# Patient Record
Sex: Male | Born: 1970 | Race: White | Hispanic: No | Marital: Married | Smoking: Current every day smoker
Health system: Southern US, Community
[De-identification: ages and names within clinical notes are randomized; demographics above are authoritative.]

## PROBLEM LIST (undated history)

## (undated) DIAGNOSIS — G4730 Sleep apnea, unspecified: Secondary | ICD-10-CM

## (undated) HISTORY — PX: APPENDECTOMY: SHX54

## (undated) HISTORY — PX: SMALL INTESTINE SURGERY: SHX150

---

## 2018-07-16 ENCOUNTER — Encounter: Payer: Self-pay | Admitting: Cardiology

## 2018-07-16 ENCOUNTER — Ambulatory Visit (INDEPENDENT_AMBULATORY_CARE_PROVIDER_SITE_OTHER): Payer: BLUE CROSS/BLUE SHIELD | Admitting: Cardiology

## 2018-07-16 DIAGNOSIS — R000 Tachycardia, unspecified: Secondary | ICD-10-CM | POA: Insufficient documentation

## 2018-07-16 DIAGNOSIS — F17200 Nicotine dependence, unspecified, uncomplicated: Secondary | ICD-10-CM | POA: Diagnosis not present

## 2018-07-16 DIAGNOSIS — K50919 Crohn's disease, unspecified, with unspecified complications: Secondary | ICD-10-CM | POA: Diagnosis not present

## 2018-07-16 DIAGNOSIS — R0789 Other chest pain: Secondary | ICD-10-CM

## 2018-07-16 DIAGNOSIS — IMO0001 Reserved for inherently not codable concepts without codable children: Secondary | ICD-10-CM | POA: Insufficient documentation

## 2018-07-16 DIAGNOSIS — K5090 Crohn's disease, unspecified, without complications: Secondary | ICD-10-CM | POA: Insufficient documentation

## 2018-07-16 NOTE — Patient Instructions (Signed)
Medication Instructions:  Your physician recommends that you continue on your current medications as directed. Please refer to the Current Medication list given to you today.  If you need a refill on your cardiac medications before your next appointment, please call your pharmacy.   Lab work: NONE  If you have labs (blood work) drawn today and your tests are completely normal, you will receive your results only by: Marland Kitchen MyChart Message (if you have MyChart) OR . A paper copy in the mail If you have any lab test that is abnormal or we need to change your treatment, we will call you to review the results.  Testing/Procedures: Your physician has requested that you have an echocardiogram. Echocardiography is a painless test that uses sound waves to create images of your heart. It provides your doctor with information about the size and shape of your heart and how well your heart's chambers and valves are working. This procedure takes approximately one hour. There are no restrictions for this procedure.  Your physician has recommended that you wear a holter monitor. Holter monitors are medical devices that record the heart's electrical activity. Doctors most often use these monitors to diagnose arrhythmias. Arrhythmias are problems with the speed or rhythm of the heartbeat. The monitor is a small, portable device. You can wear one while you do your normal daily activities. This is usually used to diagnose what is causing palpitations/syncope (passing out). You will wear this device for 48 hours.  Your physician has requested that you have a stress echocardiogram. For further information please visit [email or web address] Please follow instruction sheet as given.      Follow-Up: At [facility], you and your health needs are our priority.  As part of our continuing mission to provide you with exceptional heart care, we have created designated Provider Care Teams.  These Care Teams include your  primary Cardiologist (physician) and Advanced Practice Providers (APPs -  Physician Assistants and Nurse Practitioners) who all work together to provide you with the care you need, when you need it. You will need a follow up appointment in 6 weeks.     Any Other Special Instructions Will Be Listed Below  Exercise Stress Test An exercise stress test is a test to check how your heart works during exercise. You will need to walk on a treadmill or ride an exercise bike for this test. An electrocardiogram (ECG) will record your heartbeat when you are at rest and when you are exercising. You may have an ultrasound or nuclear test after the exercise test. The test is done to check for coronary artery disease (CAD). It is also done to:  See how well you can exercise.  Watch for high blood pressure during exercise.  Test how well you can exercise after treatment.  Check the blood flow to your arms and legs. If your test result is not normal, more testing may be needed. What happens before the procedure?  Follow instructions from your doctor about what you cannot eat or drink. ? Do not have any drinks or foods that have caffeine in them for 24 hours before the test, or as told by your doctor. This includes coffee, tea (even decaf tea), sodas, chocolate, and cocoa.  Ask your doctor about changing or stopping your normal medicines. This is important if you: ? Take diabetes medicines. ? Take beta-blocker medicines. ? Wear a nitroglycerin patch.  If you use an inhaler, bring it with you to the test.  Do not put  lotions, powders, creams, or oils on your chest before the test.  Wear comfortable shoes and clothing.  Do not use any products that have nicotine or tobacco in them, such as cigarettes and e-cigarettes. Stop using them at least 4 hours before the test. If you need help quitting, ask your doctor. What happens during the procedure?  Patches (electrodes) will be put on your chest.  Wires  will be connected to the patches. The wires will send signals to a machine to record your heartbeat.  Your heart rate will be watched while you are resting and while you are exercising. Your blood pressure will also be watched during the test.  You will walk on a treadmill or use a stationary bike. If you cannot use these, you may be asked to turn a crank with your hands.  The activity will get harder and will raise your heart rate.  You may be asked to breathe into a tube a few times during the test. This measures the gases that you breathe out.  You will be asked how you are feeling throughout the test.  You will exercise until your heart reaches a target heart rate. You will stop early if: ? You feel dizzy. ? You have chest pain. ? You are out of breath. ? Your blood pressure is too high or too low. ? You have an irregular heartbeat. ? You have pain or aching in your arms or legs. The procedure may vary among doctors and hospitals. What happens after the procedure?  Your blood pressure, heart rate, breathing rate, and blood oxygen level will be watched after the test.  You may return to your normal diet and activities as told by your doctor.  It is up to you to get the results of your test. Ask your doctor, or the department that is doing the test, when your results will be ready. Summary  An exercise stress test is a test to check how your heart works during exercise.  This test is done to check for coronary artery disease.  Your heart rate will be watched while you are resting and while you are exercising.  Follow instructions from your doctor about what you cannot eat or drink before the test. This information is not intended to replace advice given to you by your health care provider. Make sure you discuss any questions you have with your health care provider. Document Released: 10/17/2007 Document Revised: 07/31/2016 Document Reviewed: 07/31/2016 Elsevier Interactive  Patient Education  2019 [employer].    Echocardiogram An echocardiogram is a procedure that uses painless sound waves (ultrasound) to produce an image of the heart. Images from an echocardiogram can provide important information about:  Signs of coronary artery disease (CAD).  Aneurysm detection. An aneurysm is a weak or damaged part of an artery wall that bulges out from the normal force of blood pumping through the body.  Heart size and shape. Changes in the size or shape of the heart can be associated with certain conditions, including heart failure, aneurysm, and CAD.  Heart muscle function.  Heart valve function.  Signs of a past heart attack.  Fluid buildup around the heart.  Thickening of the heart muscle.  A tumor or infectious growth around the heart valves. Tell a health care provider about:  Any allergies you have.  All medicines you are taking, including vitamins, herbs, eye drops, creams, and over-the-counter medicines.  Any blood disorders you have.  Any surgeries you have had.  Any medical conditions you have.  Whether you are pregnant or may be pregnant. What are the risks? Generally, this is a safe procedure. However, problems may occur, including:  Allergic reaction to dye (contrast) that may be used during the procedure. What happens before the procedure? No specific preparation is needed. You may eat and drink normally. What happens during the procedure?   An IV tube may be inserted into one of your veins.  You may receive contrast through this tube. A contrast is an injection that improves the quality of the pictures from your heart.  A gel will be applied to your chest.  A wand-like tool (transducer) will be moved over your chest. The gel will help to transmit the sound waves from the transducer.  The sound waves will harmlessly bounce off of your heart to allow the heart images to be captured in real-time motion. The images will be  recorded on a computer. The procedure may vary among health care providers and hospitals. What happens after the procedure?  You may return to your normal, everyday life, including diet, activities, and medicines, unless your health care provider tells you not to do that. Summary  An echocardiogram is a procedure that uses painless sound waves (ultrasound) to produce an image of the heart.  Images from an echocardiogram can provide important information about the size and shape of your heart, heart muscle function, heart valve function, and fluid buildup around your heart.  You do not need to do anything to prepare before this procedure. You may eat and drink normally.  After the echocardiogram is completed, you may return to your normal, everyday life, unless your health care provider tells you not to do that. This information is not intended to replace advice given to you by your health care provider. Make sure you discuss any questions you have with your health care provider. Document Released: 04/27/2000 Document Revised: 06/02/2016 Document Reviewed: 06/02/2016 Elsevier Interactive Patient Education  2019 [employer].

## 2018-07-16 NOTE — Progress Notes (Signed)
Cardiology Consultation:    Date:  07/16/2018   ID:  Trevor Willis, DOB [birth year], MRN [id number]  PCP:  Baldo Ash, FNP  Cardiologist:  Gypsy Balsam, MD   Referring MD: Baldo Ash, FNP   Chief Complaint  Patient presents with  . Tachycardia  I have fast heart rate and chest pain  History of Present Illness:    Trevor Willis is a 48 y.o. male who is being seen today for the evaluation of tachycardia and chest pain at the request of Baldo Ash, FNP.  For years he knew that he got elevated heart rate he does have a Fitbit and during the day his heart rates typically between 1 10-1 30.  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  He does not have any dizziness he never passed out.  He recently was put on beta-blocker which slows down his heart rate.  He tells me that he takes beta-blocker for night.  Does not have side effect of this medication.  He also complained of having chest pain pain is located in the left side of his chest which he described the strength of the pain as 2-3 in scale up to 10.  There is no provoking or relieving factors that pain can last up to an hour.  He does not have any shortness of breath he does not have any dizziness associated with this.  Taking deep breath coughing does not make any difference. Risk factors for coronary artery disease include smoking.  Likely his cholesterol looks good.  He does not have family history of premature coronary disease.  No past medical history on file.    Current Medications: Current Meds  Medication Sig  . metoprolol succinate (TOPROL-XL) 100 MG 24 hr tablet Take 100 mg by mouth daily. Take with or immediately following a meal.  . valsartan (DIOVAN) 80 MG tablet Take 1 tablet by mouth daily.  . [DISCONTINUED] metoprolol tartrate (LOPRESSOR) 100 MG tablet Take 1 tablet by mouth daily.      Allergies:   Promethazine   Social History   Socioeconomic History  . Marital status: Married    Spouse name: Not on file  . Number of children: Not on file  . Years of education: Not on file  . Highest education level: Not on file  Occupational History  . Not on file  Social Needs  . Financial resource strain: Not on file  . Food insecurity:    Worry: Not on file    Inability: Not on file  . Transportation needs:    Medical: Not on file    Non-medical: Not on file  Tobacco Use  . Smoking status: Current Every Day Smoker    Types: Cigarettes  . Smokeless tobacco: Never Used  Substance and Sexual Activity  . Alcohol use: Yes    Comment: mild  . Drug use: Never  . Sexual activity: Not on file  Lifestyle  . Physical activity:    Days per week: Not on file    Minutes per session: Not on file  . Stress: Not on file  Relationships  . Social connections:    Talks on phone: Not on file    Gets together: Not on file    Attends religious service: Not on file    Active member of club or organization: Not on file  Attends meetings of clubs or organizations: Not on file    Relationship status: Not on file  Other Topics Concern  . Not on file  Social History Narrative  . Not on file     Family History: The patient's family history includes Anxiety disorder in his mother; Breast cancer in his mother; Diabetes in his mother; Hypertension in his father and mother. ROS:   Please see the history of present illness.    All 14 point review of systems negative except as described per history of present illness.  EKGs/Labs/Other Studies Reviewed:    The following studies were reviewed today: EKG showed sinus tachycardia normal P interval normal QS complex duration morphology   Recent Labs: No results found for requested labs within last 8760 hours.  Recent Lipid Panel No results found for: CHOL, TRIG, HDL, CHOLHDL, VLDL, LDLCALC, LDLDIRECT  Physical Exam:    VS:  BP  110/80   Pulse 82   Ht 6\' 1"  (1.854 m)   Wt 218 lb 3.2 oz (99 kg)   SpO2 97%   BMI 28.79 kg/m     Wt Readings from Last 3 Encounters:  07/16/18 218 lb 3.2 oz (99 kg)     GEN:  Well nourished, well developed in no acute distress HEENT: Normal NECK: No JVD; No carotid bruits LYMPHATICS: No lymphadenopathy CARDIAC: RRR, no murmurs, no rubs, no gallops RESPIRATORY:  Clear to auscultation without rales, wheezing or rhonchi  ABDOMEN: Soft, non-tender, non-distended MUSCULOSKELETAL:  No edema; No deformity  SKIN: Warm and dry NEUROLOGIC:  Alert and oriented x 3 PSYCHIATRIC:  Normal affect   ASSESSMENT:    1. Atypical chest pain   2. Tachycardia   3. Crohn's disease with complication, unspecified gastrointestinal tract location (HCC)   4. Smoking    PLAN:    In order of problems listed above:  1. Atypical chest pain.  He does have some risk factors for coronary artery disease therefore the most prudent approach to this situation will be to proceed with stress testing.  I will schedule him to have stress echocardiogram. 2. Tachycardia he is not anemic his oxygen is good his thyroid profile is also normal.  I will ask him to wear 48 hours Holter monitor to see what the heart rate distribution is.  And see what the mechanism of his tachycardia is.  As a part of evaluation I will ask him to have an echocardiogram done to assess left ventricular ejection fraction.  Stress test also will be done to see heart rate behavior during the exercise. 3. Crohn's disease stable he does not use any medications. 4. Smoking obviously a problem had a long discussion about it and I strongly recommended to quit.   Medication Adjustments/Labs and Tests Ordered: Current medicines are reviewed at length with the patient today.  Concerns regarding medicines are outlined above.  No orders of the defined types were placed in this encounter.  No orders of the defined types were placed in this  encounter.   Signed, Georgeanna Lea, MD, [facility]. 07/16/2018 9:52 AM    [facility]

## 2018-08-11 ENCOUNTER — Telehealth: Payer: Self-pay | Admitting: Cardiology

## 2018-08-11 NOTE — Telephone Encounter (Signed)
Echo 04/09-stress echo 04/10

## 2018-08-11 NOTE — Addendum Note (Signed)
Addended by: Roddie Mc on: 08/11/2018 03:43 PM   Modules accepted: Orders

## 2018-08-14 ENCOUNTER — Telehealth: Payer: Self-pay | Admitting: Cardiology

## 2018-08-14 NOTE — Telephone Encounter (Signed)
° °  Primary Cardiologist:  No primary care provider on file.   Patient contacted.  History reviewed.  No symptoms to suggest any unstable cardiac conditions.  Based on discussion, with current pandemic situation, we will be postponing this appointment for Verline Lema with a plan for f/u in 6-12 wks or sooner if feasible/necessary.  If symptoms change, he has been instructed to contact our office.   Routing to C19 CANCEL pool for tracking (P CV DIV CV19 CANCEL - reason for visit "other.") and assigning priority (1 = 4-6 wks, 2 = 6-12 wks, 3 = >12 wks).   Samella Parr  08/14/2018 10:34 AM         .

## 2018-08-14 NOTE — Telephone Encounter (Signed)
2-4/17 °

## 2018-08-18 ENCOUNTER — Other Ambulatory Visit: Payer: BLUE CROSS/BLUE SHIELD

## 2018-08-21 ENCOUNTER — Other Ambulatory Visit: Payer: BLUE CROSS/BLUE SHIELD

## 2018-08-22 ENCOUNTER — Other Ambulatory Visit: Payer: BLUE CROSS/BLUE SHIELD

## 2018-08-26 NOTE — Telephone Encounter (Signed)
Left voicemail to schedule for a Televisit

## 2018-08-26 NOTE — Telephone Encounter (Signed)
Recalls for Echo and Stress Echo placed

## 2018-08-29 ENCOUNTER — Ambulatory Visit: Payer: BLUE CROSS/BLUE SHIELD | Admitting: Cardiology

## 2018-10-02 ENCOUNTER — Other Ambulatory Visit: Payer: Self-pay

## 2018-10-02 ENCOUNTER — Ambulatory Visit (INDEPENDENT_AMBULATORY_CARE_PROVIDER_SITE_OTHER): Payer: BLUE CROSS/BLUE SHIELD

## 2018-10-02 DIAGNOSIS — R000 Tachycardia, unspecified: Secondary | ICD-10-CM

## 2018-10-02 DIAGNOSIS — R0789 Other chest pain: Secondary | ICD-10-CM

## 2018-10-02 NOTE — Progress Notes (Signed)
Complete echocardiogram has been performed.  Jimmy Sheralee Qazi RDCS, RVT 

## 2020-06-12 ENCOUNTER — Emergency Department (HOSPITAL_COMMUNITY): Payer: No Typology Code available for payment source

## 2020-06-12 ENCOUNTER — Other Ambulatory Visit: Payer: Self-pay

## 2020-06-12 ENCOUNTER — Emergency Department (HOSPITAL_COMMUNITY)
Admission: EM | Admit: 2020-06-12 | Discharge: 2020-06-13 | Disposition: A | Discharge status: Home / Self Care | Payer: No Typology Code available for payment source | Attending: Emergency Medicine | Admitting: Emergency Medicine

## 2020-06-12 DIAGNOSIS — Y92410 Unspecified street and highway as the place of occurrence of the external cause: Secondary | ICD-10-CM | POA: Insufficient documentation

## 2020-06-12 DIAGNOSIS — R059 Cough, unspecified: Secondary | ICD-10-CM | POA: Diagnosis not present

## 2020-06-12 DIAGNOSIS — F17210 Nicotine dependence, cigarettes, uncomplicated: Secondary | ICD-10-CM | POA: Insufficient documentation

## 2020-06-12 DIAGNOSIS — R0781 Pleurodynia: Secondary | ICD-10-CM | POA: Diagnosis not present
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
## 2020-06-13 MED ORDER — LIDOCAINE 5 % EX PTCH: 1.0000 | MEDICATED_PATCH | CUTANEOUS | 0 refills | Status: AC

## 2020-06-13 NOTE — Discharge Instructions (Addendum)
You were evaluated in the Emergency Department and after careful evaluation, we did not find any emergent condition requiring admission or further testing in the hospital.  Your exam/testing today was overall reassuring.  Symptoms seem to be due to bruised ribs.  X-ray did not show any broken bones or emergencies.  You will likely be more sore tomorrow.  Please use Tylenol 1000 mg every 4-6 hours at home.  He can also use the numbing patches provided.  Please return to the Emergency Department if you experience any worsening of your condition.  Thank you for allowing [place] to be a part of your care.
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
## 2020-09-11 ENCOUNTER — Inpatient Hospital Stay (HOSPITAL_COMMUNITY)
Admission: EM | Admit: 2020-09-11 | Discharge: 2020-09-15 | DRG: 386 | Disposition: A | Discharge status: Home / Self Care | Payer: BC Managed Care – PPO | Attending: Internal Medicine | Admitting: Internal Medicine

## 2020-09-11 ENCOUNTER — Other Ambulatory Visit: Payer: Self-pay

## 2020-09-11 ENCOUNTER — Encounter (HOSPITAL_COMMUNITY): Payer: Self-pay

## 2020-09-11 ENCOUNTER — Emergency Department (HOSPITAL_COMMUNITY): Payer: BC Managed Care – PPO

## 2020-09-11 DIAGNOSIS — Z20822 Contact with and (suspected) exposure to covid-19: Secondary | ICD-10-CM | POA: Diagnosis present

## 2020-09-11 DIAGNOSIS — Z8379 Family history of other diseases of the digestive system: Secondary | ICD-10-CM

## 2020-09-11 DIAGNOSIS — E871 Hypo-osmolality and hyponatremia: Secondary | ICD-10-CM | POA: Diagnosis present

## 2020-09-11 DIAGNOSIS — Z8249 Family history of ischemic heart disease and other diseases of the circulatory system: Secondary | ICD-10-CM

## 2020-09-11 DIAGNOSIS — F17210 Nicotine dependence, cigarettes, uncomplicated: Secondary | ICD-10-CM | POA: Diagnosis present

## 2020-09-11 DIAGNOSIS — K5080 Crohn's disease of both small and large intestine without complications: Secondary | ICD-10-CM | POA: Diagnosis not present

## 2020-09-11 DIAGNOSIS — L089 Local infection of the skin and subcutaneous tissue, unspecified: Secondary | ICD-10-CM | POA: Diagnosis present

## 2020-09-11 DIAGNOSIS — K5010 Crohn's disease of large intestine without complications: Secondary | ICD-10-CM | POA: Diagnosis present

## 2020-09-11 DIAGNOSIS — G4700 Insomnia, unspecified: Secondary | ICD-10-CM | POA: Diagnosis present

## 2020-09-11 DIAGNOSIS — Z9049 Acquired absence of other specified parts of digestive tract: Secondary | ICD-10-CM

## 2020-09-11 DIAGNOSIS — K56699 Other intestinal obstruction unspecified as to partial versus complete obstruction: Secondary | ICD-10-CM

## 2020-09-11 DIAGNOSIS — K56609 Unspecified intestinal obstruction, unspecified as to partial versus complete obstruction: Secondary | ICD-10-CM

## 2020-09-11 DIAGNOSIS — G4733 Obstructive sleep apnea (adult) (pediatric): Secondary | ICD-10-CM | POA: Diagnosis present

## 2020-09-11 DIAGNOSIS — K5900 Constipation, unspecified: Secondary | ICD-10-CM

## 2020-09-11 DIAGNOSIS — Z79899 Other long term (current) drug therapy: Secondary | ICD-10-CM

## 2020-09-11 DIAGNOSIS — K50919 Crohn's disease, unspecified, with unspecified complications: Secondary | ICD-10-CM | POA: Diagnosis not present

## 2020-09-11 DIAGNOSIS — I10 Essential (primary) hypertension: Secondary | ICD-10-CM | POA: Diagnosis present

## 2020-09-11 DIAGNOSIS — K5669 Other partial intestinal obstruction: Secondary | ICD-10-CM | POA: Diagnosis present

## 2020-09-11 DIAGNOSIS — G2581 Restless legs syndrome: Secondary | ICD-10-CM | POA: Diagnosis present

## 2020-09-11 DIAGNOSIS — N179 Acute kidney failure, unspecified: Secondary | ICD-10-CM | POA: Diagnosis not present

## 2020-09-11 DIAGNOSIS — K567 Ileus, unspecified: Secondary | ICD-10-CM

## 2020-09-11 DIAGNOSIS — E876 Hypokalemia: Secondary | ICD-10-CM | POA: Diagnosis not present

## 2020-09-11 DIAGNOSIS — Z888 Allergy status to other drugs, medicaments and biological substances status: Secondary | ICD-10-CM

## 2020-09-11 DIAGNOSIS — R000 Tachycardia, unspecified: Secondary | ICD-10-CM

## 2020-09-11 DIAGNOSIS — E861 Hypovolemia: Secondary | ICD-10-CM | POA: Diagnosis present

## 2020-09-11 HISTORY — DX: Sleep apnea, unspecified: G47.30

## 2020-09-11 LAB — COMPREHENSIVE METABOLIC PANEL
ALT: 25 U/L (ref 0–44)
AST: 27 U/L (ref 15–41)
Albumin: 3.8 g/dL (ref 3.5–5.0)
Alkaline Phosphatase: 71 U/L (ref 38–126)
Anion gap: 12 (ref 5–15)
BUN: 11 mg/dL (ref 6–20)
CO2: 24 mmol/L (ref 22–32)
Calcium: 9.3 mg/dL (ref 8.9–10.3)
Chloride: 98 mmol/L (ref 98–111)
Creatinine, Ser: 1.59 mg/dL — ABNORMAL HIGH (ref 0.61–1.24)
GFR, Estimated: 54 mL/min — ABNORMAL LOW (ref >60)
Glucose, Bld: 120 mg/dL — ABNORMAL HIGH (ref 70–99)
Potassium: 3.5 mmol/L (ref 3.5–5.1)
Sodium: 134 mmol/L — ABNORMAL LOW (ref 135–145)
Total Bilirubin: 1.4 mg/dL — ABNORMAL HIGH (ref 0.3–1.2)
Total Protein: 7.6 g/dL (ref 6.5–8.1)

## 2020-09-11 LAB — URINALYSIS, ROUTINE W REFLEX MICROSCOPIC
Bacteria, UA: NONE SEEN
Bilirubin Urine: NEGATIVE
Glucose, UA: NEGATIVE mg/dL
Hgb urine dipstick: NEGATIVE
Ketones, ur: NEGATIVE mg/dL
Leukocytes,Ua: NEGATIVE
Nitrite: NEGATIVE
Protein, ur: 100 mg/dL — AB
Specific Gravity, Urine: 1.015 (ref 1.005–1.030)
pH: 5 (ref 5.0–8.0)

## 2020-09-11 LAB — CBC WITH DIFFERENTIAL/PLATELET
Abs Immature Granulocytes: 0.04 10*3/uL (ref 0.00–0.07)
Basophils Absolute: 0.1 10*3/uL (ref 0.0–0.1)
Basophils Relative: 0 %
Eosinophils Absolute: 0.1 10*3/uL (ref 0.0–0.5)
Eosinophils Relative: 1 %
HCT: 48.2 % (ref 39.0–52.0)
Hemoglobin: 17.1 g/dL — ABNORMAL HIGH (ref 13.0–17.0)
Immature Granulocytes: 0 %
Lymphocytes Relative: 22 %
Lymphs Abs: 2.8 10*3/uL (ref 0.7–4.0)
MCH: 31.8 pg (ref 26.0–34.0)
MCHC: 35.5 g/dL (ref 30.0–36.0)
MCV: 89.8 fL (ref 80.0–100.0)
Monocytes Absolute: 0.9 10*3/uL (ref 0.1–1.0)
Monocytes Relative: 7 %
Neutro Abs: 9 10*3/uL — ABNORMAL HIGH (ref 1.7–7.7)
Neutrophils Relative %: 70 %
Platelets: 224 10*3/uL (ref 150–400)
RBC: 5.37 MIL/uL (ref 4.22–5.81)
RDW: 12.4 % (ref 11.5–15.5)
WBC: 12.8 10*3/uL — ABNORMAL HIGH (ref 4.0–10.5)
nRBC: 0 % (ref 0.0–0.2)

## 2020-09-11 LAB — GLUCOSE, CAPILLARY: Glucose-Capillary: 139 mg/dL — ABNORMAL HIGH (ref 70–99)

## 2020-09-11 LAB — SARS CORONAVIRUS 2 (TAT 6-24 HRS): SARS Coronavirus 2: NEGATIVE

## 2020-09-11 LAB — LIPASE, BLOOD: Lipase: 43 U/L (ref 11–51)

## 2020-09-11 MED ORDER — IOHEXOL 300 MG/ML  SOLN
100.0000 mL | Freq: Once | INTRAMUSCULAR | Status: AC | PRN
  Administered 2020-09-11: 100 mL via INTRAVENOUS

## 2020-09-11 MED ORDER — INSULIN ASPART 100 UNIT/ML IJ SOLN
0.0000 [IU] | Freq: Three times a day (TID) | INTRAMUSCULAR | Status: DC
  Administered 2020-09-12: 3 [IU] via SUBCUTANEOUS
  Administered 2020-09-12 (×2): 2 [IU] via SUBCUTANEOUS

## 2020-09-11 MED ORDER — ONDANSETRON HCL 4 MG/2ML IJ SOLN
4.0000 mg | Freq: Once | INTRAMUSCULAR | Status: AC
  Administered 2020-09-11: 4 mg via INTRAVENOUS
  Filled 2020-09-11: qty 2

## 2020-09-11 MED ORDER — LACTATED RINGERS IV SOLN: INTRAVENOUS | Status: DC

## 2020-09-11 MED ORDER — ACETAMINOPHEN 325 MG PO TABS
650.0000 mg | ORAL_TABLET | Freq: Four times a day (QID) | ORAL | Status: DC | PRN
  Administered 2020-09-13 (×2): 650 mg via ORAL
  Filled 2020-09-11 (×2): qty 2

## 2020-09-11 MED ORDER — ASPIRIN 81 MG PO CHEW: 81.0000 mg | CHEWABLE_TABLET | ORAL | Status: DC | PRN

## 2020-09-11 MED ORDER — FENTANYL CITRATE (PF) 100 MCG/2ML IJ SOLN
50.0000 ug | Freq: Once | INTRAMUSCULAR | Status: AC
Start: 2020-09-11 — End: 2020-09-11
  Administered 2020-09-11: 50 ug via INTRAVENOUS
  Filled 2020-09-11: qty 2

## 2020-09-11 MED ORDER — ENOXAPARIN SODIUM 40 MG/0.4ML IJ SOSY
40.0000 mg | PREFILLED_SYRINGE | INTRAMUSCULAR | Status: DC
  Administered 2020-09-11 – 2020-09-14 (×4): 40 mg via SUBCUTANEOUS
  Filled 2020-09-11 (×4): qty 0.4

## 2020-09-11 MED ORDER — METHYLPREDNISOLONE SODIUM SUCC 125 MG IJ SOLR
60.0000 mg | Freq: Two times a day (BID) | INTRAMUSCULAR | Status: DC
  Administered 2020-09-11 – 2020-09-15 (×8): 60 mg via INTRAVENOUS
  Filled 2020-09-11 (×8): qty 2

## 2020-09-11 MED ORDER — RAMELTEON 8 MG PO TABS
8.0000 mg | ORAL_TABLET | Freq: Every day | ORAL | Status: DC
  Administered 2020-09-11: 8 mg via ORAL
  Filled 2020-09-11 (×2): qty 1

## 2020-09-11 MED ORDER — SODIUM CHLORIDE 0.9 % IV BOLUS
1000.0000 mL | Freq: Once | INTRAVENOUS | Status: AC
  Administered 2020-09-11: 1000 mL via INTRAVENOUS

## 2020-09-11 MED ORDER — ONDANSETRON HCL 4 MG PO TABS
4.0000 mg | ORAL_TABLET | Freq: Three times a day (TID) | ORAL | Status: DC | PRN
  Administered 2020-09-12 – 2020-09-13 (×2): 4 mg via ORAL
  Filled 2020-09-11 (×3): qty 1

## 2020-09-11 NOTE — H&P (Addendum)
Date: 09/11/2020               Patient Name:  Trevor Willis MRN: [id number]  DOB: Nov 15, 1970 Age / Sex: 50 y.o., male   PCP: Eunice Blase, PA-C         Medical Service: Internal Medicine Teaching Service         Attending Physician: Dr. Inez Catalina, MD    First Contact: Dr. Evlyn Kanner Pager: [phone]  Second Contact: Dr. Eliezer Bottom Pager: [phone]       After Hours (After 5p/  First Contact Pager: [phone]  weekends / holidays): Second Contact Pager: [phone]   Chief Complaint: abdominal pain/nausea/vomiting  History of Present Illness: Trevor Willis is a 50 year old male with PMHx of Crohn's disease s/p ileocecal resection, carpal tunnel syndrome, obstructive sleep apnea not using CPAP, and insomnia secondary to reported restless leg syndrome presenting with abdominal pain, nausea/vomiting for 3-4 days duration. Patient was fist diagnosed with Crohn's disease 38 years ago with last Crohn's flare reportedly at age 53. Since then, he notes most of his complications have been related to strictures and adhesions. He is not on any disease modifying therapy at this time. He does not have a GI physician that he follows with but notes that he was in his usual state of health until Thursday when he started colon cleanse to prep for colonoscopy for colon cancer screening. He noted having significant diarrhea and nausea/vomiting with 3-4 episodes per day of non-bloody non-bilious projectile vomiting. He endorses associated sweats and chills but no subjective fevers. His last bowel movement was two days ago. He endorses ongoing nausea and decreased appetite as a result. He does endorse persistent nonradiating lower  abdominal pain that is worse with movement and describes it as "someone punching in my gut". He endorses his urine being darker in color but denies any dysuria. He drinks 4L of Dr. Reino Kent daily. He denies any chest pain, shortness of breath, lightheadedness/dizziness.   Meds:   Current Meds  Medication Sig  . aspirin 81 MG chewable tablet Chew 81 mg by mouth as needed (for chest pain).  . meloxicam (MOBIC) 15 MG tablet Take 15 mg by mouth daily.  . metoprolol succinate (TOPROL-XL) 100 MG 24 hr tablet Take 100 mg by mouth at bedtime. Take with or immediately following a meal.  . sulfamethoxazole-trimethoprim (BACTRIM DS) 800-160 MG tablet Take 1 tablet by mouth daily.  . valsartan (DIOVAN) 80 MG tablet Take 80 mg by mouth daily.   Allergies: Allergies as of 09/11/2020 - Review Complete 09/11/2020  Allergen Reaction Noted  . Promethazine Anxiety 07/16/2018  . Other Other (See Comments) 09/11/2020   Past Medical History:  Diagnosis Date  . Sleep apnea    Family History:  Family History  Problem Relation Age of Onset  . Diabetes Mother   . Hypertension Mother   . Anxiety disorder Mother   . Breast cancer Mother   . Hypertension Father   Patient has three aunt's with severe Crohn's disease, one who passed from complications related to treatment regimen.   Social History:  Patient lives at home with his wife and two children, age 54 and 64. 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 He has smoked approximately 2 packs of cigarettes weekly since the age of 70, sometimes more. Currently smokes about 2-3 cigarettes daily. He  has about 3 alcoholic drinks weekly. He uses CBG over the counter for insomnia and pain.   Review of Systems: A complete ROS was negative except as per HPI.   Physical Exam: Blood pressure 97/80, pulse 79, temperature 97.9 F (36.6 C), temperature source Oral, resp. rate (!) 21, height 6\' 1"  (1.854 m), weight 87.1 kg, SpO2 97 %. Physical Exam  Constitutional: Appears well-developed and well-nourished. No distress.  HENT: Normocephalic and atraumatic, EOMI, conjunctiva normal, edentulous, moist mucous  membranes Cardiovascular: Normal rate, regular rhythm, S1 and S2 present, no murmurs, rubs, gallops.  Distal pulses intact Respiratory: No respiratory distress, no accessory muscle use.  Effort is normal.  Lungs are clear to auscultation bilaterally. GI: Nondistended, soft, diffuse tenderness to palpation, worse in epigastric region and lower abdomen; decreased bowel sounds Musculoskeletal: Normal bulk and tone.  No peripheral edema noted. Neurological: Is alert and oriented x4, no apparent focal deficits noted. Skin: Warm and dry.  No rash, erythema, lesions noted. Psychiatric: Normal mood and affect. Behavior is normal. Judgment and thought content normal.   EKG: pending  CT ABDOMEN/PELVIS W CONTRAST:  IMPRESSION: 1. Fluid-filled distended jejunal loops with diffuse circumferential wall thickening in the mid and distal ileum with gradual tapering of small bowel through the mid segments into the ileum. Prior ileocecal resection with narrowing and mucosal hyperenhancement in the neo terminal ileum. 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 2. Markedly prominent lateral segment left liver and caudate lobe. Appearance similar to prior study with probable geographic fatty deposition in the caudate lobe of the liver. 3. Scattered lymph nodes in the small bowel mesentery, likely reactive.  Assessment & Plan by Problem: Active Problems:   Crohn's colitis [facility])  Trevor Willis is a 50 year old male with PMHx of Crohn's disease s/p ileocecal resection, carpal tunnel syndrome, obstructive sleep apnea not using CPAP, and insomnia secondary to reported restless leg syndrome presenting with abdominal pain, nausea/vomiting with CT Abdomen/Pelvis findings consistent with a enteritis.   Crohn's colitis  Patient has a history of Crohn's disease since age of 88 with last flare at age 89. He is s/p ileocecal  resection. He is not on any disease modifying agents at this time. He presents with 3 days of nausea/vomiting, abdominal pain since taking bowel prep for a screening colonoscopy. Initially also had profuse diarrhea that has since resolved and last BM was day prior to admission. Notes four episodes daily of projectile nonbilious nonbloody emesis with persistent nausea resulting in decreased oral intake. Abdominal tenderness to palpation in epigastric and lower abdomen region with decreased bowel sounds. CT Abdomen/Pelvis consistent with infectious/inflammatory enteritis with inflammatory stricture at neo-terminal ileum. Afebrile with mild leukocytosis on labs.  - GI consulted, appreciate recommendations - IV solumedrol 60mg  q12h - IV Zofran 4mg  q8h prn  - Trend CBC  Acute kidney injury Hypovolemic hyponatremia  No known history of renal disease although UA with mild proteinuria. Patient noted to have sCr to 1.59 on admission. Also noted to have slight decrease in Na to 134. Likely prerenal in setting of GI losses.  - LR 100 cc/hr - Monitor renal function - Holding home valsartan and bactrim for now - Avoid nephrotoxic agents as able  OSA Patient notes he was recently diagnosed with obstructive sleep apnea but has not been able to get a CPAP yet as machines are on backorder. He denies any dyspnea at this time.  - CPAP nightly  Reported RLS causing Insomnia Patient notes that he has a history of restless leg syndrome and is constantly moving around at night trying to find a position of comfort. He has tried CBD in the past to help with sleep but notes that this has not been very effective. - Ramelteon 8mg  qHS - Iron studies  - TSH  Hypertension Sinus tachycardia  Patient notes he has chronic tachycardia with HR in 110's in the past for which he has seen a cardiologist and was recommended to start a beta blocker. He is on metoprolol 100mg  daily. He is also on valsartan for hypertension.  However, BP's have been low normal on admission and HR is 80's - Will hold home BP meds at this time   Diet: Clear liquid Fluids: LR 100 cc/hr DVT Prophylaxis: Lovenox  Code status: FULL   Dispo: Admit patient to Inpatient with expected length of stay greater than 2 midnights.  Signed: , MD  [facility] PGY-2 09/11/2020, 2:01 PM  Pager: [phone] After 5pm on weekdays and 1pm on weekends: On Call pager: [phone]

## 2020-09-11 NOTE — ED Provider Notes (Signed)
[facility] EMERGENCY DEPARTMENT Provider Note   CSN: [id number] Arrival date & time: 09/11/20  0827     History Chief Complaint  Patient presents with  . Abdominal Pain  . Nausea    Trevor Willis is a 50 y.o. male.  Presented to ER with concern for abdominal pain nausea and vomiting.  Patient reports that he has had symptoms since Thursday.  Started after he initiated bowel prep for colonoscopy.  Initially was having bowel movements, multiple loose stool but no bowel movement today.  Vomiting is nonbloody nonbilious.  Pain is generalized.  Currently moderate.  No fevers.  Reports that he was doing a bowel prep for routine colonoscopy for Friday but this had to be canceled due to financial concerns.  Has a history of Crohn's disease, not currently on any medications for this.  HPI     Past Medical History:  Diagnosis Date  . Sleep apnea     Patient Active Problem List   Diagnosis Date Noted  . Crohn's colitis (HCC) 09/11/2020  . Atypical chest pain 07/16/2018  . Tachycardia 07/16/2018  . Crohn's disease (HCC) 07/16/2018  . Smoking 07/16/2018    Past Surgical History:  Procedure Laterality Date  . APPENDECTOMY    . SMALL INTESTINE SURGERY     Had 18 inches taken out due to a block       Family History  Problem Relation Age of Onset  . Diabetes Mother   . Hypertension Mother   . Anxiety disorder Mother   . Breast cancer Mother   . Hypertension Father     Social History   Tobacco Use  . Smoking status: Current Every Day Smoker    Types: Cigarettes  . Smokeless tobacco: Never Used  Vaping Use  . Vaping Use: Never used  Substance Use Topics  . Alcohol use: Yes    Comment: mild  . Drug use: Never    Home Medications Prior to Admission medications   Medication Sig Start Date End Date Taking? Authorizing Provider  lidocaine (LIDODERM) 5 % Place 1 patch onto the skin daily. Remove & Discard patch within 12 hours or as directed by MD 06/13/20    Sabas Sous, MD  metoprolol succinate (TOPROL-XL) 100 MG 24 hr tablet Take 100 mg by mouth daily. Take with or immediately following a meal.    [provider]  valsartan (DIOVAN) 80 MG tablet Take 1 tablet by mouth daily. 06/02/18   [provider]    Allergies    Promethazine  Review of Systems   Review of Systems  Constitutional: Negative for chills and fever.  HENT: Negative for ear pain and sore throat.   Eyes: Negative for pain and visual disturbance.  Respiratory: Negative for cough and shortness of breath.   Cardiovascular: Negative for chest pain and palpitations.  Gastrointestinal: Positive for abdominal pain, diarrhea, nausea and vomiting.  Genitourinary: Negative for dysuria and hematuria.  Musculoskeletal: Negative for arthralgias and back pain.  Skin: Negative for color change and rash.  Neurological: Negative for seizures and syncope.  All other systems reviewed and are negative.   Physical Exam Updated Vital Signs BP 97/80   Pulse 79   Temp 97.9 F (36.6 C) (Oral)   Resp (!) 21   Ht 6\' 1"  (1.854 m)   Wt 87.1 kg   SpO2 97%   BMI 25.33 kg/m   Physical Exam Vitals and nursing note reviewed.  Constitutional:      Appearance:  He is well-developed.  HENT:     Head: Normocephalic and atraumatic.  Eyes:     Conjunctiva/sclera: Conjunctivae normal.  Cardiovascular:     Rate and Rhythm: Normal rate and regular rhythm.     Heart sounds: No murmur heard.   Pulmonary:     Effort: Pulmonary effort is normal. No respiratory distress.     Breath sounds: Normal breath sounds.  Abdominal:     Palpations: Abdomen is soft.     Tenderness: There is abdominal tenderness.     Comments: Generalized tenderness to palpation, no rebound or guarding  Musculoskeletal:     Cervical back: Neck supple.  Skin:    General: Skin is warm and dry.  Neurological:     General: No focal deficit present.     Mental Status: He is alert.  Psychiatric:         Mood and Affect: Mood normal.     ED Results / Procedures / Treatments   Labs (all labs ordered are listed, but only abnormal results are displayed) Labs Reviewed  CBC WITH DIFFERENTIAL/PLATELET - Abnormal; Notable for the following components:      Result Value   WBC 12.8 (*)    Hemoglobin 17.1 (*)    Neutro Abs 9.0 (*)    All other components within normal limits  COMPREHENSIVE METABOLIC PANEL - Abnormal; Notable for the following components:   Sodium 134 (*)    Glucose, Bld 120 (*)    Creatinine, Ser 1.59 (*)    Total Bilirubin 1.4 (*)    GFR, Estimated 54 (*)    All other components within normal limits  URINALYSIS, ROUTINE W REFLEX MICROSCOPIC - Abnormal; Notable for the following components:   Color, Urine AMBER (*)    APPearance HAZY (*)    Protein, ur 100 (*)    All other components within normal limits  LIPASE, BLOOD    EKG None  Radiology CT ABDOMEN PELVIS W CONTRAST  Result Date: 09/11/2020 CLINICAL DATA:  Abdominal pain with nausea vomiting. EXAM: CT ABDOMEN AND PELVIS WITH CONTRAST TECHNIQUE: Multidetector CT imaging of the abdomen and pelvis was performed using the standard protocol following bolus administration of intravenous contrast. CONTRAST:  OMNIPAQUE IOHEXOL 300 MG/ML  SOLN COMPARISON:  CT urogram 08/07/2017 FINDINGS: Lower chest: Basilar atelectasis.  Otherwise unremarkable. Hepatobiliary: No suspicious focal abnormality within the liver parenchyma. Markedly prominent lateral segment left liver and caudate lobe. Appearance similar to prior study with probable geographic fatty deposition in the caudate lobe of the liver. There is no evidence for gallstones, gallbladder wall thickening, or pericholecystic fluid. No intrahepatic or extrahepatic biliary dilation. Pancreas: No focal mass lesion. No dilatation of the main duct. No intraparenchymal cyst. No peripancreatic edema. Spleen: No splenomegaly. No focal mass lesion. Adrenals/Urinary Tract: No adrenal  nodule or mass. Right kidney unremarkable. Tiny hypodensity in the interpolar left kidney is too small to characterize but likely benign. No evidence for hydroureter. Bladder is nondistended. Stomach/Bowel: Stomach is unremarkable. No gastric wall thickening. No evidence of outlet obstruction. Duodenum is normally positioned as is the ligament of Treitz. Jejunal loops are distended up to 4.7 cm diameter. There is gradual tapering of small bowel through the mid segments into the ileum which shows diffuse circumferential wall thickening. Bowel anatomy suggests prior ileocecectomy and there is narrowing of the neo terminal ileum with mucosal hyperenhancement today. Some mild fecalization of enteric contents noted just proximal to the neo terminal ileum with scattered areas of subtle mucosal hyperenhancement  in the distal ileum. Vasa recta day in the distal ileum are mildly engorged. Colon unremarkable. Vascular/Lymphatic: No abdominal aortic aneurysm. No abdominal aortic atherosclerotic calcification. Portal vein and superior mesenteric vein are patent. There is no gastrohepatic or hepatoduodenal ligament lymphadenopathy. No retroperitoneal or mesenteric lymphadenopathy. Scattered lymph nodes are seen in the small bowel mesentery. No pelvic sidewall lymphadenopathy. Reproductive: Prostate gland unremarkable. Other: Scattered interloop mesenteric fluid noted in the central mesentery. Musculoskeletal: No worrisome lytic or sclerotic osseous abnormality. IMPRESSION: 1. Fluid-filled distended jejunal loops with diffuse circumferential wall thickening in the mid and distal ileum with gradual tapering of small bowel through the mid segments into the ileum. Prior ileocecal resection with narrowing and mucosal hyperenhancement in the neo terminal ileum. 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 2. Markedly prominent lateral segment left  liver and caudate lobe. Appearance similar to prior study with probable geographic fatty deposition in the caudate lobe of the liver. 3. Scattered lymph nodes in the small bowel mesentery, likely reactive. Electronically Signed   By: Kennith Center M.D.   On: 09/11/2020 12:07    Procedures Procedures   Medications Ordered in ED Medications  methylPREDNISolone sodium succinate (SOLU-MEDROL) 125 mg/2 mL injection 60 mg (has no administration in time range)  enoxaparin (LOVENOX) injection 40 mg (has no administration in time range)  fentaNYL (SUBLIMAZE) injection 50 mcg (50 mcg Intravenous Given 09/11/20 1028)  ondansetron (ZOFRAN) injection 4 mg (4 mg Intravenous Given 09/11/20 1027)  sodium chloride 0.9 % bolus 1,000 mL (1,000 mLs Intravenous New Bag/Given 09/11/20 1027)  iohexol (OMNIPAQUE) 300 MG/ML solution 100 mL (100 mLs Intravenous Contrast Given 09/11/20 1128)    ED Course  I have reviewed the triage vital signs and the nursing notes.  Pertinent labs & imaging results that were available during my care of the patient were reviewed by me and considered in my medical decision making (see chart for details).    MDM Rules/Calculators/A&P                          50 year old male presenting to ER with concern for nausea vomiting diarrhea.  History of Crohn's disease.  On exam patient well-appearing but noted to have some abdominal tenderness.  Basic labs noted for mild elevation in creatinine but otherwise stable.  CT scan concerning for inflammatory stricture in the terminal ileum, significantly dilated and wall thickening of the jejunum.  Possible bowel obstruction.  Discussed case with gastroenterology, Dr. Bosie Clos.  Someone from their team will evaluate this afternoon or tomorrow morning.  Recommends initiating IV steroids, admit to medicine for further management.  Discussed case with internal medicine teaching service, they will come evaluate patient and admit.  Final Clinical  Impression(s) / ED Diagnoses Final diagnoses:  Crohn's disease with complication, unspecified gastrointestinal tract location [facility])  Stricture intestinal (HCC)    Rx / DC Orders ED Discharge Orders    None       Milagros Loll, MD 09/11/20 1341

## 2020-09-11 NOTE — ED Notes (Signed)
Patient transported to CT 

## 2020-09-11 NOTE — Hospital Course (Addendum)
Subjective:  Reports feeling well; improved sleep once outside of the bed. He still has not had a BM but does note having more gas. Tolerating diet - lunch and dinner. Didn't eat breakfast. GI visited earlier, he says wants to get x-ray. Some nausea this morning with belching. Mild abdominal pain. Feels bloated.

## 2020-09-11 NOTE — Progress Notes (Addendum)
Received pt from ED via wheelchair transport by ED staff. Pt appears with no apparent distress joking and laughing, denies current nausea, endorses current 3/10 LUQ pain and 4-5/10 RUQ RLQ LLQ and mid abdomin. Reports pain has improved and endorses abdominal pain was 7/10 pta

## 2020-09-11 NOTE — Consult Note (Signed)
Referring Provider: Dr. Stevie Kern Primary Care Physician:  Eunice Blase, PA-C Primary Gastroenterologist:  Dr. Bosie Clos  Reason for Consultation:  Crohn's flare  HPI: Trevor Willis is a 50 y.o. male with long history of Crohn's disease diagnosed at age 7 who reports having a partial small bowel resection (10 inches) removed at age 79. Previous ileocecal resection. Denies previous biologics. Has been on intermittent steroid courses over the years for reported adhesional pain. Has chronic loose stools of 1-5 nonbloody stools per day without abdominal pain or bleeding. 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 He was seen once by me in 2019 and a colonoscopy was planned but it was cancelled due to pneumonia and he never rescheduled it. He reports feeling ok until this past Thursday when he took a colon prep for a planned colonoscopy ordered by his GI doctor in [place]. Developed severe nausea and vomiting after drinking the colon prep. Vomiting was nonbloody. No blood with colon prep. +chills late last week without fevers. COVID test pending.  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  Past Medical History:  Diagnosis Date  . Sleep apnea     Past Surgical History:  Procedure Laterality Date  . APPENDECTOMY    . SMALL INTESTINE SURGERY     Had 18 inches taken out due to a block    Prior to Admission medications   Medication Sig Start Date End Date Taking? Authorizing Provider  aspirin 81 MG chewable tablet Chew 81 mg by mouth as needed (for chest pain).   Yes [provider]  meloxicam (MOBIC) 15 MG tablet Take 15 mg by mouth daily.   Yes [provider]  metoprolol succinate (TOPROL-XL) 100 MG 24 hr tablet Take 100 mg by mouth at bedtime. Take with or immediately following a meal.   Yes [provider]  mupirocin ointment (BACTROBAN) 2 % Apply 1 application topically See admin instructions. Apply to affected area of the left arm pit once a day   Yes  [provider]  NON FORMULARY Take 1 tablet by mouth See admin instructions. CBD gummie: Chew 1 large gummie at bedtime   Yes [provider]  POTASSIUM PO Take 600 mg by mouth daily.   Yes [provider]  sulfamethoxazole-trimethoprim (BACTRIM DS) 800-160 MG tablet Take 1 tablet by mouth daily. 08/19/20  Yes [provider]  valsartan (DIOVAN) 40 MG tablet Take 80 mg by mouth daily.   Yes [provider]  lidocaine (LIDODERM) 5 % Place 1 patch onto the skin daily. Remove & Discard patch within 12 hours or as directed by MD Patient not taking: No sig reported 06/13/20   Sabas Sous, MD  valsartan (DIOVAN) 80 MG tablet Take 80 mg by mouth daily. 06/02/18   [provider]    Scheduled Meds: . enoxaparin (LOVENOX) injection  40 mg Subcutaneous Q24H  . [START ON 09/12/2020] insulin aspart  0-15 Units Subcutaneous TID WC  . methylPREDNISolone (SOLU-MEDROL) injection  60 mg Intravenous Q12H  . ramelteon  8 mg Oral QHS   Continuous Infusions: . lactated ringers 100 mL/hr at 09/11/20 1529   PRN Meds:.acetaminophen, aspirin, ondansetron  Allergies as of 09/11/2020 - Review Complete 09/11/2020  Allergen Reaction Noted  . Promethazine Anxiety 07/16/2018  . Milk-related compounds Diarrhea 09/11/2020  . Other Other (See Comments) 09/11/2020    Family History  Problem Relation Age of Onset  . Diabetes Mother   . Hypertension Mother   . Anxiety disorder Mother   .  Breast cancer Mother   . Hypertension Father     Social History   Socioeconomic History  . Marital status: Married    Spouse name: Not on file  . Number of children: Not on file  . Years of education: Not on file  . Highest education level: Not on file  Occupational History  . Not on file  Tobacco Use  . Smoking status: Current Every Day Smoker    Types: Cigarettes  . Smokeless tobacco: Never Used  Vaping Use  . Vaping Use: Never used  Substance and Sexual Activity   . Alcohol use: Yes    Comment: mild  . Drug use: Never  . Sexual activity: Not on file  Other Topics Concern  . Not on file  Social History Narrative  . Not on file   Social Determinants of Health   Financial Resource Strain: Not on file  Food Insecurity: Not on file  Transportation Needs: Not on file  Physical Activity: Not on file  Stress: Not on file  Social Connections: Not on file  Intimate Partner Violence: Not on file    Review of Systems: All negative except as stated above in HPI.  Physical Exam: Vital signs: Vitals:   09/11/20 1405 09/11/20 1512  BP:  98/65  Pulse:  78  Resp:  17  Temp: 97.6 F (36.4 C) 98.1 F (36.7 C)  SpO2:  94%   Last BM Date: 09/11/20 General:   Alert,  Well-developed, well-nourished, pleasant and cooperative in NAD Head: normocephalic, atraumatic Eyes: anicteric sclera ENT: oropharynx clear Neck: supple, nontender Lungs:  Clear throughout to auscultation.   No wheezes, crackles, or rhonchi. No acute distress. Heart:  Regular rate and rhythm; no murmurs, clicks, rubs,  or gallops. Abdomen: periumiblical tenderness (greatest in epigastric) with guarding, soft, nondistended, +BS  Rectal:  Deferred Ext: no edema  GI:  Lab Results: Recent Labs    09/11/20 0919  WBC 12.8*  HGB 17.1*  HCT 48.2  PLT 224   BMET Recent Labs    09/11/20 0919  NA 134*  K 3.5  CL 98  CO2 24  GLUCOSE 120*  BUN 11  CREATININE 1.59*  CALCIUM 9.3   LFT Recent Labs    09/11/20 0919  PROT 7.6  ALBUMIN 3.8  AST 27  ALT 25  ALKPHOS 71  BILITOT 1.4*   PT/INR No results for input(s): LABPROT, INR in the last 72 hours.   Studies/Results: CT ABDOMEN PELVIS W CONTRAST  Result Date: 09/11/2020 CLINICAL DATA:  Abdominal pain with nausea vomiting. EXAM: CT ABDOMEN AND PELVIS WITH CONTRAST TECHNIQUE: Multidetector CT imaging of the abdomen and pelvis was performed using the standard protocol following bolus administration of intravenous  contrast. CONTRAST:  OMNIPAQUE IOHEXOL 300 MG/ML  SOLN COMPARISON:  CT urogram 08/07/2017 FINDINGS: Lower chest: Basilar atelectasis.  Otherwise unremarkable. Hepatobiliary: No suspicious focal abnormality within the liver parenchyma. Markedly prominent lateral segment left liver and caudate lobe. Appearance similar to prior study with probable geographic fatty deposition in the caudate lobe of the liver. There is no evidence for gallstones, gallbladder wall thickening, or pericholecystic fluid. No intrahepatic or extrahepatic biliary dilation. Pancreas: No focal mass lesion. No dilatation of the main duct. No intraparenchymal cyst. No peripancreatic edema. Spleen: No splenomegaly. No focal mass lesion. Adrenals/Urinary Tract: No adrenal nodule or mass. Right kidney unremarkable. Tiny hypodensity in the interpolar left kidney is too small to characterize but likely benign. No evidence for hydroureter. Bladder is nondistended. Stomach/Bowel:  Stomach is unremarkable. No gastric wall thickening. No evidence of outlet obstruction. Duodenum is normally positioned as is the ligament of Treitz. Jejunal loops are distended up to 4.7 cm diameter. There is gradual tapering of small bowel through the mid segments into the ileum which shows diffuse circumferential wall thickening. Bowel anatomy suggests prior ileocecectomy and there is narrowing of the neo terminal ileum with mucosal hyperenhancement today. Some mild fecalization of enteric contents noted just proximal to the neo terminal ileum with scattered areas of subtle mucosal hyperenhancement in the distal ileum. Vasa recta day in the distal ileum are mildly engorged. Colon unremarkable. Vascular/Lymphatic: No abdominal aortic aneurysm. No abdominal aortic atherosclerotic calcification. Portal vein and superior mesenteric vein are patent. There is no gastrohepatic or hepatoduodenal ligament lymphadenopathy. No retroperitoneal or mesenteric lymphadenopathy.  Scattered lymph nodes are seen in the small bowel mesentery. No pelvic sidewall lymphadenopathy. Reproductive: Prostate gland unremarkable. Other: Scattered interloop mesenteric fluid noted in the central mesentery. Musculoskeletal: No worrisome lytic or sclerotic osseous abnormality. IMPRESSION: 1. Fluid-filled distended jejunal loops with diffuse circumferential wall thickening in the mid and distal ileum with gradual tapering of small bowel through the mid segments into the ileum. Prior ileocecal resection with narrowing and mucosal hyperenhancement in the neo terminal ileum. 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 2. Markedly prominent lateral segment left liver and caudate lobe. Appearance similar to prior study with probable geographic fatty deposition in the caudate lobe of the liver. 3. Scattered lymph nodes in the small bowel mesentery, likely reactive. Electronically Signed   By: Kennith Center M.D.   On: 09/11/2020 12:07    Impression/Plan: 50 yo with longstanding Crohn's ileocolitis with previous ileocectomy presenting with partial SBO with an ileal stricture in need of IV steroids. Clear liquid diet. Will need [facility] as an outpt. May need a biologic in the future if stricture resolves and if it does not resolve then may need surgical revision of his anastomosis. [facility] will f/u tomorrow.    LOS: 0 days   Shirley Friar  09/11/2020, 5:24 PM  Questions please call [phone]

## 2020-09-11 NOTE — ED Notes (Signed)
Attempted report x1. 

## 2020-09-11 NOTE — ED Triage Notes (Signed)
Pt arrived POV from home c/o of abdominal pain and nausea/vomitting since Thursday. Pt has a hx of Chron's disease and was scheduled for a colonoscopy on Friday. Pt did the colon cleanse to prepare on Thursday night arrived Friday and was unaware he needed to pay so he left without having it done but now is c/o of excessive vomiting and diarrhea. Wife states vomit looks exactly like how it did in [place] when his potassium tanked.

## 2020-09-12 DIAGNOSIS — E871 Hypo-osmolality and hyponatremia: Secondary | ICD-10-CM | POA: Diagnosis present

## 2020-09-12 DIAGNOSIS — G2581 Restless legs syndrome: Secondary | ICD-10-CM | POA: Diagnosis present

## 2020-09-12 DIAGNOSIS — G4733 Obstructive sleep apnea (adult) (pediatric): Secondary | ICD-10-CM | POA: Diagnosis present

## 2020-09-12 DIAGNOSIS — I10 Essential (primary) hypertension: Secondary | ICD-10-CM | POA: Diagnosis present

## 2020-09-12 DIAGNOSIS — Z20822 Contact with and (suspected) exposure to covid-19: Secondary | ICD-10-CM | POA: Diagnosis present

## 2020-09-12 DIAGNOSIS — F17210 Nicotine dependence, cigarettes, uncomplicated: Secondary | ICD-10-CM | POA: Diagnosis present

## 2020-09-12 DIAGNOSIS — K50919 Crohn's disease, unspecified, with unspecified complications: Secondary | ICD-10-CM | POA: Diagnosis not present

## 2020-09-12 DIAGNOSIS — G4700 Insomnia, unspecified: Secondary | ICD-10-CM | POA: Diagnosis present

## 2020-09-12 DIAGNOSIS — N179 Acute kidney failure, unspecified: Secondary | ICD-10-CM | POA: Diagnosis present

## 2020-09-12 DIAGNOSIS — Z888 Allergy status to other drugs, medicaments and biological substances status: Secondary | ICD-10-CM | POA: Diagnosis not present

## 2020-09-12 DIAGNOSIS — G4709 Other insomnia: Secondary | ICD-10-CM | POA: Diagnosis not present

## 2020-09-12 DIAGNOSIS — Z79899 Other long term (current) drug therapy: Secondary | ICD-10-CM | POA: Diagnosis not present

## 2020-09-12 DIAGNOSIS — Z9049 Acquired absence of other specified parts of digestive tract: Secondary | ICD-10-CM | POA: Diagnosis not present

## 2020-09-12 DIAGNOSIS — Z8249 Family history of ischemic heart disease and other diseases of the circulatory system: Secondary | ICD-10-CM | POA: Diagnosis not present

## 2020-09-12 DIAGNOSIS — K5669 Other partial intestinal obstruction: Secondary | ICD-10-CM | POA: Diagnosis present

## 2020-09-12 DIAGNOSIS — Z8379 Family history of other diseases of the digestive system: Secondary | ICD-10-CM | POA: Diagnosis not present

## 2020-09-12 DIAGNOSIS — K50112 Crohn's disease of large intestine with intestinal obstruction: Secondary | ICD-10-CM | POA: Diagnosis not present

## 2020-09-12 DIAGNOSIS — K5080 Crohn's disease of both small and large intestine without complications: Secondary | ICD-10-CM | POA: Diagnosis present

## 2020-09-12 DIAGNOSIS — E861 Hypovolemia: Secondary | ICD-10-CM | POA: Diagnosis present

## 2020-09-12 DIAGNOSIS — K5010 Crohn's disease of large intestine without complications: Secondary | ICD-10-CM | POA: Diagnosis present

## 2020-09-12 DIAGNOSIS — L089 Local infection of the skin and subcutaneous tissue, unspecified: Secondary | ICD-10-CM | POA: Diagnosis present

## 2020-09-12 DIAGNOSIS — E876 Hypokalemia: Secondary | ICD-10-CM | POA: Diagnosis not present

## 2020-09-12 LAB — IRON AND TIBC
Iron: 95 ug/dL (ref 45–182)
Saturation Ratios: 29 % (ref 17.9–39.5)
TIBC: 332 ug/dL (ref 250–450)
UIBC: 237 ug/dL

## 2020-09-12 LAB — FERRITIN: Ferritin: 222 ng/mL (ref 24–336)

## 2020-09-12 LAB — BASIC METABOLIC PANEL
Anion gap: 8 (ref 5–15)
BUN: 8 mg/dL (ref 6–20)
CO2: 27 mmol/L (ref 22–32)
Calcium: 9.2 mg/dL (ref 8.9–10.3)
Chloride: 100 mmol/L (ref 98–111)
Creatinine, Ser: 1 mg/dL (ref 0.61–1.24)
GFR, Estimated: >60 mL/min (ref >60)
Glucose, Bld: 147 mg/dL — ABNORMAL HIGH (ref 70–99)
Potassium: 3.5 mmol/L (ref 3.5–5.1)
Sodium: 135 mmol/L (ref 135–145)

## 2020-09-12 LAB — HEMOGLOBIN A1C
Hgb A1c MFr Bld: 5 % (ref 4.8–5.6)
Mean Plasma Glucose: 96.8 mg/dL

## 2020-09-12 LAB — GLUCOSE, CAPILLARY
Glucose-Capillary: 155 mg/dL — ABNORMAL HIGH (ref 70–99)
Glucose-Capillary: 136 mg/dL — ABNORMAL HIGH (ref 70–99)
Glucose-Capillary: 138 mg/dL — ABNORMAL HIGH (ref 70–99)
Glucose-Capillary: 146 mg/dL — ABNORMAL HIGH (ref 70–99)

## 2020-09-12 LAB — CBC
HCT: 43.6 % (ref 39.0–52.0)
Hemoglobin: 15.4 g/dL (ref 13.0–17.0)
MCH: 32 pg (ref 26.0–34.0)
MCHC: 35.3 g/dL (ref 30.0–36.0)
MCV: 90.5 fL (ref 80.0–100.0)
Platelets: 168 10*3/uL (ref 150–400)
RBC: 4.82 MIL/uL (ref 4.22–5.81)
RDW: 12.3 % (ref 11.5–15.5)
WBC: 11.9 10*3/uL — ABNORMAL HIGH (ref 4.0–10.5)
nRBC: 0 % (ref 0.0–0.2)

## 2020-09-12 LAB — TSH: TSH: 0.387 u[IU]/mL (ref 0.350–4.500)

## 2020-09-12 LAB — HIV ANTIBODY (ROUTINE TESTING W REFLEX): HIV Screen 4th Generation wRfx: NONREACTIVE

## 2020-09-12 LAB — HEPATITIS B SURFACE ANTIGEN: Hepatitis B Surface Ag: NONREACTIVE

## 2020-09-12 MED ORDER — SULFAMETHOXAZOLE-TRIMETHOPRIM 800-160 MG PO TABS
1.0000 | ORAL_TABLET | Freq: Every day | ORAL | Status: DC
  Administered 2020-09-12 – 2020-09-15 (×4): 1 via ORAL
  Filled 2020-09-12 (×4): qty 1

## 2020-09-12 MED ORDER — TRAZODONE HCL 50 MG PO TABS
50.0000 mg | ORAL_TABLET | Freq: Every day | ORAL | Status: DC
  Administered 2020-09-12: 50 mg via ORAL
  Filled 2020-09-12: qty 1

## 2020-09-12 NOTE — Progress Notes (Signed)
Subjective:  HD1  Overnight, no acute events reported.  This morning, [name] was evaluated at bedside. He is feeling well overall. He was concerned about his food being mostly vegetables but he is unable to tolerate this. No further nausea or vomiting. He does endorse that his abdominal pain has also resolved. Has not had a BM yet. He discussed with GI this morning regarding advancing his diet and recommendation for prednisone taper with goal to get him on DMARD.   Objective:  Vital signs in last 24 hours: Vitals:   09/11/20 1947 09/11/20 2200 09/11/20 2341 09/12/20 0351  BP: 97/64  120/74 102/66  Pulse: 91 89 90 97  Resp: 18 16 18 16   Temp: 98.7 F (37.1 C)  98 F (36.7 C) 97.8 F (36.6 C)  TempSrc: Oral  Oral Oral  SpO2: 95% 96% 96% 95%  Weight:      Height:       Physical Exam: General: Laying in bed, no acute distress CV: Regular rate, rhythm. No m/r/g appreciated Pulm: Normal work of breahtingClear to ausculation bilaterally. Abdomen: Soft, non-tender, non-distended. Decreased bowel sounds.  CBC Latest Ref Rng & Units 09/12/2020 09/11/2020  WBC 4.0 - 10.5 K/uL 11.9(H) 12.8(H)  Hemoglobin 13.0 - 17.0 g/dL 11/11/2020 17.1(H)  Hematocrit 39.0 - 52.0 % 43.6 48.2  Platelets 150 - 400 K/uL 168 224   BMP Latest Ref Rng & Units 09/12/2020 09/11/2020  Glucose 70 - 99 mg/dL 11/11/2020) 195(K)  BUN 6 - 20 mg/dL 8 11  Creatinine 932(I - 1.24 mg/dL 7.12 4.58)  Sodium 0.99(I - 145 mmol/L 135 134(L)  Potassium 3.5 - 5.1 mmol/L 3.5 3.5  Chloride 98 - 111 mmol/L 100 98  CO2 22 - 32 mmol/L 27 24  Calcium 8.9 - 10.3 mg/dL 9.2 9.3   Assessment/Plan: Trevor Willis is 49yo person living with Crohn's disease s/p ileocecal resection, carpal tunnel syndrome, OSA not on CPAP admitted 5/1 with Crohn's colitis.  Active Problems:   Crohn's colitis (HCC)  #Crohn's colitis Patient continues to be afebrile, hemodynamically stable. Reports improvement of symptoms this morning. Will continue with IV  steroids today with plans to switch to oral taper tomorrow. Diet has been advanced. Given he is able to tolerate this well, expect discharge tomorrow. Patient to follow-up with GI in outpatient for further maintenance treatment. - Appreciate GI recommendations - IV methylprednisone 60mg  q12h - Zofran 4mg  q8h PRN - CBG monitoring - Trending CBC  #OSA Patient states he was unable to tolerate CPAP last night, will try again tonight. - CPAP QHS  #Recurrent skin infection Patient reports he has recurrent skin infections underneath his left arm for which he takes Bactrim daily. Initially held Bactrim in setting of AKI, but with improvement will re-start today. - Re-start home Bactrim 80-160mg  qd  #Insomnia Iron studies normal. Patient reports ramelteon did not work and only had 2 hours of sleep. Will try trazadone this evening. - Start trazodone 50mg  QHS  #AKI, resolved Renal function improved after IVF, stopped fluids as he has been able to tolerate po. Will continue to monitor renal function. - Daily BMP  #Hx hypertension BP continues to be soft, will continue holding home ARB, beta blocker.  DIET: Clears IVF: LR 100cc/hr DVT PPX:  Lovenox BOWEL: n/a CODE: FULL FAM COM: n/a  Prior to Admission Living Arrangement: Home Anticipated Discharge Location: Home Barriers to Discharge: Medical managemenht Dispo: Anticipated discharge in approximately 1-2 day(s).   7/1, MD 09/12/2020, 7:29 AM Pager:  [phone] After 5pm on weekdays and 1pm on weekends: On Call pager [phone]

## 2020-09-12 NOTE — Discharge Summary (Signed)
Name: Trevor Willis MRN: [id number] DOB: Oct 23, 1970 50 y.o. PCP: Eunice Blase, PA-C  Date of Admission: 09/11/2020  8:29 AM Date of Discharge: 09/15/2020 Attending Physician: Inez Catalina, MD  Subjective: Patient evaluated at bedside this AM. He says he is feeling pretty good, was able to have bowel movement this morning. Tolerated food yesterday without issues, denies nausea. Discussed discharge today, will continue with steroids for 57mo with plans to follow-up with GI.  Discharge Diagnosis: 1. Crohn's colitis complicated by partial SBO 2. Acute kidney injury 3. OSA 4. Insomnia 5. Hypertension  Discharge Medications: Allergies as of 09/15/2020      Reactions   Promethazine Anxiety   Other Other (See Comments)   THE PATIENT HAS CROHN'S DISEASE- Has to watch what he eats      Medication List    STOP taking these medications   NON FORMULARY     TAKE these medications   aspirin 81 MG chewable tablet Chew 81 mg by mouth as needed (for chest pain).   lidocaine 5 % Commonly known as: Lidoderm Place 1 patch onto the skin daily. Remove & Discard patch within 12 hours or as directed by MD   meloxicam 15 MG tablet Commonly known as: MOBIC Take 15 mg by mouth daily.   metoprolol succinate 100 MG 24 hr tablet Commonly known as: TOPROL-XL Take 100 mg by mouth at bedtime. Take with or immediately following a meal.   mupirocin ointment 2 % Commonly known as: BACTROBAN Apply 1 application topically See admin instructions. Apply to affected area of the left arm pit once a day   ondansetron 4 MG tablet Commonly known as: Zofran Take 1 tablet (4 mg total) by mouth every 8 (eight) hours as needed for up to 14 days for nausea or vomiting.   POTASSIUM PO Take 600 mg by mouth daily.   predniSONE 10 MG tablet Commonly known as: DELTASONE Take 4 tablets (40 mg total) by mouth daily for 7 days, THEN 3 tablets (30 mg total) daily for 7 days, THEN 2 tablets (20 mg total) daily for 7  days, THEN 1 tablet (10 mg total) daily for 7 days. Start taking on: Sep 16, 2020   sulfamethoxazole-trimethoprim 800-160 MG tablet Commonly known as: BACTRIM DS Take 1 tablet by mouth daily.   traZODone 100 MG tablet Commonly known as: DESYREL Take 1 tablet (100 mg total) by mouth at bedtime.   valsartan 80 MG tablet Commonly known as: DIOVAN Take 80 mg by mouth daily. What changed: Another medication with the same name was removed. Continue taking this medication, and follow the directions you see here.       Disposition and follow-up:   Mr.Trevor Willis was discharged from [facility] in Stable condition.  At the hospital follow up visit please address:   Crohn's colitis with partial SBO: At discharge patient having BM, flatus. Patient to start steroid taper, decreasing by 10mg  weekly. He is scheduled for follow-up with GI in one month to discuss maintenance medications, plans for colonoscopy.   OSA: Patient states he has been unable to obtain CPAP due to backlog of orders.    Insomnia: Mr. [name] has experienced chronic insomnia. Started on trazodone while hospitalized, although difficult to assess efficacy given hospital setting. Please follow-up for any medication changes.    Labs / imaging needed at time of follow-up: CBC, CMP   Pending labs/ test needing follow-up: n/a  Follow-up Appointments:    Follow-up Information  Charlott Rakes, MD. Schedule an appointment as soon as possible for a visit in 4 week(s).   Specialty: Gastroenterology Why: Crohn's disease Contact information: [address] [place] [address] [phone]        O'Buch, Edgardo Roys, PA-C. Schedule an appointment as soon as possible for a visit in 1 week(s).   Specialty: Internal Medicine Contact information: [address] [place] [address] [phone]               Hospital Course by problem list: 1. Crohn's colitis with partial  SBO: Patient has history of Crohn's disease, first diagnosed at age 50 requiring ileocecal resection. Since that time reports very few problems since this time. He presented to hospital on 5/1 afebrile and hemodynamically stable with 3-4 day history of abdominal pain, nausea, vomiting, and diarrhea. CT imaging revealed ileal wall thickening consistent with inflammatory process. In ED, GI consulted and recommended IV steroids. Methylprednisolone 60mg  q12h started with clear liquid diet. Overnight patient reports improvement of symptoms. GI recommended continual IV steroids and advanced diet to soft. Unfortunately, patient began reporting increased abdominal discomfort, no recent BM. Abdominal XR revealed partial SBO. At that time diet reduced to full liquid diet. After one day patient was able to have BM. Diet advanced again and symptoms improved. At discharge patient having bowel movement, denying nausea, tolerating po. Will have patient start on steroid taper and follow-up with GI in one month.   2. Acute kidney injury: On arrival sCr 1.56. Unknown baseline given no previous labs, but no known kidney disease. He reports some decreased appetite over the last few days. He also reports chronic use of Bactrim for recurrent skin infections. Initially Bactrim held and given IVF. Overnight renal function returned to normal (sCr 1.0), IVF stopped and Bactrim re-started.  3. OSA: Patient reports previously diagnosed with obstructive sleep apnea but has not been able to get CPAP due to it being back-ordered. Patient was unable to tolerate CPAP while inpatient, will need to follow-up with PCP for further assistance.  4. Insomnia: Patient reports long history of insomnia, possibly 2/2 undiagnosed restless leg syndrome. Failed trial of ramelteon, as he reports only 2 hours of sleep. Patient started on trazodone while hospitalized. Reports some improvement of sleep, although setting of hospital makes this difficult. Will  continue with trazodone as outpatient, have patient follow-up with PCP.  5. Hypertension: Patient on home valsartan and metoprolol for hypertension. Throughout admission, blood pressures soft, possibly 2/2 decreased po intake and increased GI losses, therefore home meds were held. Re-started at discharge.  Discharge Exam:   BP 121/68 (BP Location: Right Arm)   Pulse 84   Temp 98.2 F (36.8 C) (Oral)   Resp 16   Ht 6\' 1"  (1.854 m)   Wt 87.1 kg   SpO2 96%   BMI 25.33 kg/m  General: Laying in bed, no acute distress CV: Regular rate, rhythm. No m/r/g appreciated Pulm: Normal work of breathing, no use accessory muscles. GI: Soft, non-tender, non-distended. Normoactive bowel sounds. Skin: Warm, dry, no rashes or lesions appreciated. Neuro: Awake, alert, answering questions appropriately.  Pertinent Labs, Studies, and Procedures:  CBC Latest Ref Rng & Units 09/12/2020 09/11/2020  WBC 4.0 - 10.5 K/uL 11.9(H) 12.8(H)  Hemoglobin 13.0 - 17.0 g/dL 11/12/2020 17.1(H)  Hematocrit 39.0 - 52.0 % 43.6 48.2  Platelets 150 - 400 K/uL 168 224   CMP Latest Ref Rng & Units 09/12/2020 09/11/2020  Glucose 70 - 99 mg/dL 11/12/2020)  120(H)  BUN 6 - 20 mg/dL 8 11  Creatinine 2.50 - 1.24 mg/dL 5.39 7.67(H)  Sodium 419 - 145 mmol/L 135 134(L)  Potassium 3.5 - 5.1 mmol/L 3.5 3.5  Chloride 98 - 111 mmol/L 100 98  CO2 22 - 32 mmol/L 27 24  Calcium 8.9 - 10.3 mg/dL 9.2 9.3  Total Protein 6.5 - 8.1 g/dL - 7.6  Total Bilirubin 0.3 - 1.2 mg/dL - 3.7(T)  Alkaline Phos 38 - 126 U/L - 71  AST 15 - 41 U/L - 27  ALT 0 - 44 U/L - 25   CT abdomen/pelvis 5/1 1. Fluid-filled distended jejunal loops with diffuse circumferential wall thickening in the mid and distal ileum with gradual tapering of small bowel through the mid segments into the ileum. Prior ileocecal resection with narrowing and mucosal hyperenhancement in the neo terminal ileum. Imaging features are compatible with infectious/inflammatory enteritis and probable  inflammatory stricture at the neo terminal ileum. 2. Markedly prominent lateral segment left liver and caudate lobe. Appearance similar to prior study with probable geographic fatty deposition in the caudate lobe of the liver. 3. Scattered lymph nodes in the small bowel mesentery, likely reactive.  A1c 5.0 TSH 0.387 HBsAg non-reactive  Discharge Instructions:  Mr. Borg, I am so glad you are feeling better and can be discharged today! You were admitted because of your Crohn's disease. During your hospitalization, you developed a mild partial small bowel obstruction, which appears to have resolved. You will continue with steroids and follow-up with the GI doctor in about 4 weeks. Please see the following notes:  You will start you steroids tomorrow, 5/6. For seven days you will take 40mg  prednisone, then 30mg  for 7 days, then 20mg  for 7 days, then 10mg  for the last 7 days.  I have prescribed you some Zofran too as needed for nausea.   I have also prescribed a month's worth of trazodone for your insomnia. I am hoping that this will help you once you are home. Please make sure to follow-up with your primary care doctor if you need to change medications.   It was a pleasure meeting you, Mr. Volner. I wish you the best and hope you stay happy and healthy!  Thank you, , MD  Signed: , MD 09/15/2020, 10:25 AM   Pager: [phone]

## 2020-09-12 NOTE — Progress Notes (Signed)
Pt has refused cpap for tonight stating it put "too much air in his stomach" and "I already can't go to the bathroom'.  RT explained if he changed his mind he could request RT to come and place device.  Pt stated he would be discharged tomorrow if he is able to use the bathroom.  RT will continue to monitor.

## 2020-09-12 NOTE — Progress Notes (Signed)
[facility] Gastroenterology Progress Note  Trevor Willis 50 y.o. 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  Feeling better.  Wants to eat more.  Had 3 bowel movements yesterday.  No bowel movements today.  Denies nausea and vomiting.  ROS : Afebrile, negative for chest pain.   Objective: Vital signs in last 24 hours: Vitals:   09/11/20 2341 09/12/20 0351  BP: 120/74 102/66  Pulse: 90 97  Resp: 18 16  Temp: 98 F (36.7 C) 97.8 F (36.6 C)  SpO2: 96% 95%    Physical Exam:  General:  Alert, cooperative, no distress, appears stated age  Head:  Normocephalic, without obvious abnormality, atraumatic  Eyes:  , EOM's intact,   Lungs:   Clear to auscultation bilaterally, respirations unlabored  Heart:  Regular rate and rhythm, S1, S2 normal  Abdomen:   Soft, non-tender, nondistended, bowel sounds present.  Scar marks from previous surgery noted.  No peritoneal signs  Extremities: Extremities normal, atraumatic, no  edema  Pulses: 2+ and symmetric    Lab Results: Recent Labs    09/11/20 0919 09/12/20 0526  NA 134* 135  K 3.5 3.5  CL 98 100  CO2 24 27  GLUCOSE 120* 147*  BUN 11 8  CREATININE 1.59* 1.00  CALCIUM 9.3 9.2   Recent Labs    09/11/20 0919  AST 27  ALT 25  ALKPHOS 71  BILITOT 1.4*  PROT 7.6  ALBUMIN 3.8   Recent Labs    09/11/20 0919 09/12/20 0526  WBC 12.8* 11.9*  NEUTROABS 9.0*  --   HGB 17.1* 15.4  HCT 48.2 43.6  MCV 89.8 90.5  PLT 224 168   No results for input(s): LABPROT, INR in the last 72 hours.    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 -History of small bowel obstruction requiring ileocecal resection at age 85  Recommendation ------------------------ -Advance diet to soft -Continue IV steroids for today. -If tolerating diet, hopefully discharge home tomorrow with prednisone taper -He will need  maintenance treatment for his Crohn's disease as an outpatient -Check HBs antigen, QuantiFERON-TB gold and TPMT enzyme activity. -GI will follow     Kathi Der MD, FACP 09/12/2020, 8:37 AM  Contact #  [phone]

## 2020-09-13 ENCOUNTER — Inpatient Hospital Stay (HOSPITAL_COMMUNITY): Payer: BC Managed Care – PPO

## 2020-09-13 DIAGNOSIS — G4709 Other insomnia: Secondary | ICD-10-CM

## 2020-09-13 DIAGNOSIS — I10 Essential (primary) hypertension: Secondary | ICD-10-CM

## 2020-09-13 DIAGNOSIS — E876 Hypokalemia: Secondary | ICD-10-CM

## 2020-09-13 DIAGNOSIS — K50112 Crohn's disease of large intestine with intestinal obstruction: Secondary | ICD-10-CM

## 2020-09-13 LAB — GLUCOSE, CAPILLARY
Glucose-Capillary: 116 mg/dL — ABNORMAL HIGH (ref 70–99)
Glucose-Capillary: 151 mg/dL — ABNORMAL HIGH (ref 70–99)
Glucose-Capillary: 127 mg/dL — ABNORMAL HIGH (ref 70–99)
Glucose-Capillary: 144 mg/dL — ABNORMAL HIGH (ref 70–99)

## 2020-09-13 LAB — CBC
HCT: 39.2 % (ref 39.0–52.0)
Hemoglobin: 14.2 g/dL (ref 13.0–17.0)
MCH: 32.3 pg (ref 26.0–34.0)
MCHC: 36.2 g/dL — ABNORMAL HIGH (ref 30.0–36.0)
MCV: 89.3 fL (ref 80.0–100.0)
Platelets: 178 10*3/uL (ref 150–400)
RBC: 4.39 MIL/uL (ref 4.22–5.81)
RDW: 12.4 % (ref 11.5–15.5)
WBC: 18.3 10*3/uL — ABNORMAL HIGH (ref 4.0–10.5)
nRBC: 0 % (ref 0.0–0.2)

## 2020-09-13 LAB — BASIC METABOLIC PANEL
Anion gap: 8 (ref 5–15)
BUN: 7 mg/dL (ref 6–20)
CO2: 29 mmol/L (ref 22–32)
Calcium: 9.3 mg/dL (ref 8.9–10.3)
Chloride: 99 mmol/L (ref 98–111)
Creatinine, Ser: 1.05 mg/dL (ref 0.61–1.24)
GFR, Estimated: >60 mL/min (ref >60)
Glucose, Bld: 122 mg/dL — ABNORMAL HIGH (ref 70–99)
Potassium: 3.3 mmol/L — ABNORMAL LOW (ref 3.5–5.1)
Sodium: 136 mmol/L (ref 135–145)

## 2020-09-13 LAB — MAGNESIUM: Magnesium: 2 mg/dL (ref 1.7–2.4)

## 2020-09-13 MED ORDER — POTASSIUM CHLORIDE CRYS ER 20 MEQ PO TBCR
40.0000 meq | EXTENDED_RELEASE_TABLET | Freq: Two times a day (BID) | ORAL | Status: AC
  Administered 2020-09-13 (×2): 40 meq via ORAL
  Filled 2020-09-13 (×2): qty 2

## 2020-09-13 MED ORDER — TRAZODONE HCL 100 MG PO TABS
100.0000 mg | ORAL_TABLET | Freq: Every day | ORAL | Status: DC
  Administered 2020-09-13 – 2020-09-14 (×2): 100 mg via ORAL
  Filled 2020-09-13 (×2): qty 1

## 2020-09-13 MED ORDER — POLYETHYLENE GLYCOL 3350 17 G PO PACK
17.0000 g | PACK | Freq: Every day | ORAL | Status: DC
  Administered 2020-09-13: 17 g via ORAL
  Filled 2020-09-13: qty 1

## 2020-09-13 MED ORDER — SENNOSIDES-DOCUSATE SODIUM 8.6-50 MG PO TABS
2.0000 | ORAL_TABLET | Freq: Two times a day (BID) | ORAL | Status: DC
  Administered 2020-09-13: 2 via ORAL
  Filled 2020-09-13: qty 2

## 2020-09-13 NOTE — Progress Notes (Addendum)
   Subjective:  HD2  Patient evaluated at bedside this AM. Reports feeling well; improved sleep once outside of the bed. He still has not had a BM but does note having more gas. Tolerating diet, but has felt nauseous this morning and didn't eat breakfast. Also reports feeling bloated.  Objective:  Vital signs in last 24 hours: Vitals:   09/12/20 1214 09/12/20 1820 09/12/20 2306 09/13/20 0339  BP: 121/68 109/64 117/74 125/74  Pulse: 84 93 99 90  Resp: 16 16 18 18   Temp: 98.2 F (36.8 C) 98.4 F (36.9 C) 98.3 F (36.8 C) 99 F (37.2 C)  TempSrc: Oral Oral Oral Oral  SpO2: 96% 97% 93% 94%  Weight:      Height:       Physical Exam: General: Laying in chair, no acute distress CV: Regular rate, rhythm. No m/r/g appreciated Pulm: Normal work of breathing, no use accessory muscles Abdomen: Soft, mildly distended, non-tender. Normoactive bowel sounds. Neuro: Awake, alert. Moving extremities appropriately.  CBC Latest Ref Rng & Units 09/13/2020 09/12/2020 09/11/2020  WBC 4.0 - 10.5 K/uL 18.3(H) 11.9(H) 12.8(H)  Hemoglobin 13.0 - 17.0 g/dL 11/11/2020 22.4 17.1(H)  Hematocrit 39.0 - 52.0 % 39.2 43.6 48.2  Platelets 150 - 400 K/uL 178 168 224   BMP Latest Ref Rng & Units 09/13/2020 09/12/2020 09/11/2020  Glucose 70 - 99 mg/dL 11/11/2020) 003(B) 048(G)  BUN 6 - 20 mg/dL 7 8 11   Creatinine 0.61 - 1.24 mg/dL 891(Q 9.45)  Sodium 135 - 145 mmol/L 136 135 134(L)  Potassium 3.5 - 5.1 mmol/L 3.3(L) 3.5 3.5  Chloride 98 - 111 mmol/L 99 100 98  CO2 22 - 32 mmol/L 29 27 24   Calcium 8.9 - 10.3 mg/dL 9.3 9.2 9.3   Assessment/Plan: Trevor Willis is 49yo person living with Crohn's disease s/p ileocecal resection, carpal tunnel syndrome, OSA not on CPAP admitted 5/1 with Crohn's colitis with improvement of presenting symptoms.  Active Problems:   Crohn's colitis (HCC)  #Crohn's colitis Patient tolerating diet yesterday, although having some nausea this morning. Passing flatus, but no BM yet. Will continue IV  steroids this AM, follow-up abdominal x-rays to ensure no obstruction. Can transition to oral steroids pending results, per GI. Appreciate their assistance and recommendations. CBG's have been at goal with IV steroids and with no hx of DM, will d/c CBG monitoring. If patient is able to tolerate diet and have BM this afternoon, can consider discharge.  - Methylprednisolone 60mg  q12h - Zofran 4mg  q8h PRN - F/u abdominal XR  #Insomnia Patient does state he had improved sleep last night, but thinks it was due to moving to the chair. Mentions the trazodone worked some initially but wore off quickly. - Increase trazodone 100mg  QHS  #Hypokalemia K 3.4, Mg 2.0. Most likely in setting of decreased oral intake over the last few days. Will orally replete potassium. - Klor-con 8.82(C BID  #Hx hypertension BP at goal off of antihypertensives, will continue to hold while inpatient. - Hold home ARB, BB  #Chronic recurrent skin infections -Daily Bactrim 80-160mg  qd  DIET: Clears IVF: n/a DVT PPX: Lovenox BOWEL: Senokot-S CODE: FULL FAM COM: n/a  Prior to Admission Living Arrangement: Home Anticipated Discharge Location: Home Barriers to Discharge: medical management Dispo: Anticipated discharge in approximately 0-1 day(s).   , MD 09/13/2020, 10:55 AM Pager: [phone] After 5pm on weekdays and 1pm on weekends: On Call pager [phone]

## 2020-09-13 NOTE — Progress Notes (Signed)
[facility] Gastroenterology Progress Note  Trevor Willis 50 y.o. 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  Tolerated soft food yesterday but complaining of nausea this morning.  No bowel movement since yesterday.  Passing flatus.  Denies any abdominal pain.  ROS : Afebrile, negative for chest pain.   Objective: Vital signs in last 24 hours: Vitals:   09/12/20 2306 09/13/20 0339  BP: 117/74 125/74  Pulse: 99 90  Resp: 18 18  Temp: 98.3 F (36.8 C) 99 F (37.2 C)  SpO2: 93% 94%    Physical Exam:  General:  Alert, cooperative, no distress, appears stated age  Head:  Normocephalic, without obvious abnormality, atraumatic  Eyes:  , EOM's intact,   Lungs:   Clear to auscultation bilaterally, respirations unlabored  Heart:  Regular rate and rhythm, S1, S2 normal  Abdomen:   Soft, non-tender, minimal distention noted, bowel sounds present.  Scar marks from previous surgery noted.  No peritoneal signs  Extremities: Extremities normal, atraumatic, no  edema  Pulses: 2+ and symmetric    Lab Results: Recent Labs    09/12/20 0526 09/13/20 0119  NA 135 136  K 3.5 3.3*  CL 100 99  CO2 27 29  GLUCOSE 147* 122*  BUN 8 7  CREATININE 1.00 1.05  CALCIUM 9.2 9.3  MG  --  2.0   Recent Labs    09/11/20 0919  AST 27  ALT 25  ALKPHOS 71  BILITOT 1.4*  PROT 7.6  ALBUMIN 3.8   Recent Labs    09/11/20 0919 09/12/20 0526 09/13/20 0119  WBC 12.8* 11.9* 18.3*  NEUTROABS 9.0*  --   --   HGB 17.1* 15.4 14.2  HCT 48.2 43.6 39.2  MCV 89.8 90.5 89.3  PLT 224 168 178   No results for input(s): LABPROT, INR in the last 72 hours.    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 -History of small bowel obstruction requiring ileocecal resection at age 24 -Nausea with constipation.  Recommendation ------------------------ -Check abdominal x-ray to rule out  ileus/partial small bowel obstruction -If abdominal x-ray unrevealing, recommend transition to oral steroids. -Continue soft diet for now -HBs antigen negative, follow QuantiFERON-TB gold and TPMT enzyme activity. -GI will follow     Kathi Der MD, FACP 09/13/2020, 8:52 AM  Contact #  [phone]

## 2020-09-14 ENCOUNTER — Inpatient Hospital Stay (HOSPITAL_COMMUNITY): Payer: BC Managed Care – PPO

## 2020-09-14 LAB — CBC
HCT: 38.4 % — ABNORMAL LOW (ref 39.0–52.0)
Hemoglobin: 13.5 g/dL (ref 13.0–17.0)
MCH: 31.8 pg (ref 26.0–34.0)
MCHC: 35.2 g/dL (ref 30.0–36.0)
MCV: 90.4 fL (ref 80.0–100.0)
Platelets: 147 10*3/uL — ABNORMAL LOW (ref 150–400)
RBC: 4.25 MIL/uL (ref 4.22–5.81)
RDW: 12.3 % (ref 11.5–15.5)
WBC: 9.8 10*3/uL (ref 4.0–10.5)
nRBC: 0 % (ref 0.0–0.2)

## 2020-09-14 LAB — BASIC METABOLIC PANEL
Anion gap: 7 (ref 5–15)
BUN: 8 mg/dL (ref 6–20)
CO2: 29 mmol/L (ref 22–32)
Calcium: 8.9 mg/dL (ref 8.9–10.3)
Chloride: 100 mmol/L (ref 98–111)
Creatinine, Ser: 0.94 mg/dL (ref 0.61–1.24)
GFR, Estimated: >60 mL/min (ref >60)
Glucose, Bld: 115 mg/dL — ABNORMAL HIGH (ref 70–99)
Potassium: 3.9 mmol/L (ref 3.5–5.1)
Sodium: 136 mmol/L (ref 135–145)

## 2020-09-14 LAB — GLUCOSE, CAPILLARY
Glucose-Capillary: 133 mg/dL — ABNORMAL HIGH (ref 70–99)
Glucose-Capillary: 137 mg/dL — ABNORMAL HIGH (ref 70–99)
Glucose-Capillary: 149 mg/dL — ABNORMAL HIGH (ref 70–99)
Glucose-Capillary: 132 mg/dL — ABNORMAL HIGH (ref 70–99)

## 2020-09-14 LAB — QUANTIFERON-TB GOLD PLUS (RQFGPL)
QuantiFERON Mitogen Value: 1.7 IU/mL
QuantiFERON Nil Value: 0 IU/mL
QuantiFERON TB1 Ag Value: 0 IU/mL
QuantiFERON TB2 Ag Value: 0 IU/mL

## 2020-09-14 LAB — QUANTIFERON-TB GOLD PLUS: QuantiFERON-TB Gold Plus: NEGATIVE

## 2020-09-14 NOTE — Progress Notes (Signed)
[facility] Gastroenterology Progress Note  Trevor Willis 50 y.o. 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  Had a bowel movement after getting abdominal x-ray done.  Denies nausea and vomiting.  Feeling better.  ROS : Afebrile, negative for chest pain.   Objective: Vital signs in last 24 hours: Vitals:   09/13/20 2214 09/14/20 0438  BP: 117/61 130/75  Pulse: 73 82  Resp: 17 18  Temp: 97.8 F (36.6 C) 97.7 F (36.5 C)  SpO2: 96% 95%    Physical Exam:  General:  Alert, cooperative, no distress, appears stated age  Head:  Normocephalic, without obvious abnormality, atraumatic  Eyes:  , EOM's intact,   Lungs:   Clear to auscultation bilaterally, respirations unlabored  Heart:  Regular rate and rhythm, S1, S2 normal  Abdomen:   Soft, non-tender, nondistended,, bowel sounds present.  Scar marks from previous surgery noted.  No peritoneal signs  Extremities: Extremities normal, atraumatic, no  edema  Pulses: 2+ and symmetric    Lab Results: Recent Labs    09/13/20 0119 09/14/20 0115  NA 136 136  K 3.3* 3.9  CL 99 100  CO2 29 29  GLUCOSE 122* 115*  BUN 7 8  CREATININE 1.05 0.94  CALCIUM 9.3 8.9  MG 2.0  --    Recent Labs    09/11/20 0919  AST 27  ALT 25  ALKPHOS 71  BILITOT 1.4*  PROT 7.6  ALBUMIN 3.8   Recent Labs    09/11/20 0919 09/12/20 0526 09/13/20 0119 09/14/20 0115  WBC 12.8*   < > 18.3* 9.8  NEUTROABS 9.0*  --   --   --   HGB 17.1*   < > 14.2 13.5  HCT 48.2   < > 39.2 38.4*  MCV 89.8   < > 89.3 90.4  PLT 224   < > 178 147*   < > = values in this interval not displayed.   No results for input(s): LABPROT, INR in the last 72 hours.    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  Improving -History of small bowel obstruction requiring ileocecal resection at age 16 -Nausea with  constipation.  Resolved.  Recommendation ------------------------ -Feeling better now.  Had 1 bowel movement after getting abdominal x-ray.  -Advance diet to soft -Continue IV steroids for today. -Repeat abdominal x-ray in the morning -Hopefully discharge home tomorrow -GI will follow     Kathi Der MD, FACP 09/14/2020, 9:13 AM  Contact #  [phone]

## 2020-09-14 NOTE — Progress Notes (Addendum)
   Subjective:  HD3  Patient evaluated at bedside this AM. He states he feels well today. He was able to have a bowel movement this morning. He continued not to sleep well overnight despite medication. He requests "milk-related products" be taken off his allergy list as he states the diarrhea he has with this is not bothersome and he drinks milk products regularly at home without true lactose intolerance.   Objective:  Vital signs in last 24 hours: Vitals:   09/13/20 1222 09/13/20 1706 09/13/20 2214 09/14/20 0438  BP: 123/78 118/82 117/61 130/75  Pulse: 98 88 73 82  Resp: 18 18 17 18   Temp: 97.8 F (36.6 C) 98.1 F (36.7 C) 97.8 F (36.6 C) 97.7 F (36.5 C)  TempSrc: Oral Oral Oral Oral  SpO2: 98% 94% 96% 95%  Weight:      Height:       Physical Exam: General: Laying in bed comfortably, no acute distress CV: Regular rate, rhythm. No m/r/g Pulm: Normal work of breathing, no use accessory muscles Abdomen: Soft, mildly distended, non-tender. Normoactive bowel sounds.  CBC Latest Ref Rng & Units 09/14/2020 09/13/2020 09/12/2020  WBC 4.0 - 10.5 K/uL 9.8 18.3(H) 11.9(H)  Hemoglobin 13.0 - 17.0 g/dL 11/12/2020 27.7 82.4  Hematocrit 39.0 - 52.0 % 38.4(L) 39.2 43.6  Platelets 150 - 400 K/uL 147(L) 178 168   BMP Latest Ref Rng & Units 09/14/2020 09/13/2020 09/12/2020  Glucose 70 - 99 mg/dL 11/12/2020) 361(W) 431(V)  BUN 6 - 20 mg/dL 8 7 8   Creatinine 0.61 - 1.24 mg/dL 400(Q 6.76  Sodium 135 - 145 mmol/L 136 136 135  Potassium 3.5 - 5.1 mmol/L 3.9 3.3(L) 3.5  Chloride 98 - 111 mmol/L 100 99 100  CO2 22 - 32 mmol/L 29 29 27   Calcium 8.9 - 10.3 mg/dL 8.9 9.3 9.2   Assessment/Plan: Trevor Willis is 49yo person living with Crohn's disease s/p ileocecal resection, carpal tunnel syndrome, OSA not on CPAP admitted 5/1 with Crohn's colitis complicated by partial small bowel obstruction, now having bowel movement.  Active Problems:   Crohn's colitis (HCC)  #Crohn's colitis #Partial small bowel  obstruction Patient reports having a small bowel movement earlier this morning, no nausea. XR this AM prior to bowel movement showed stable partial SBO. Will advance diet to soft food, hopefully will continue to have bowel movements. Will continue with IV steroids today, plan to transition to oral tomorrow pending clinical picture. Appreciate GI recs.  - Solumedrol 60mg  q12h - Zofran 4mg  q8h PRN - Serial abdominal XR in AM  #Insomnia Patient reports he is getting some sleep, but difficult in hospital. Will continue with nightly trazodone. Suspect hospital environment is contributing to some of his sleep problems. - C/w trazodone 100mg  QHS - Labs @ 7AM - No O/N vitals  #Chronic recurrent skin infections - Continue home Bactrim daily  DIET: Soft IVF: n/a DVT PPX: Lovenox BOWEL: n/a CODE: FULL FAM COM: n/a  Prior to Admission Living Arrangement: Home Anticipated Discharge Location: Home Barriers to Discharge: medical management Dispo: Anticipated discharge in approximately 1 day(s).   , MD 09/14/2020, 8:47 AM Pager: [phone] After 5pm on weekdays and 1pm on weekends: On Call pager [phone]

## 2020-09-15 ENCOUNTER — Inpatient Hospital Stay (HOSPITAL_COMMUNITY): Payer: BC Managed Care – PPO

## 2020-09-15 DIAGNOSIS — G4733 Obstructive sleep apnea (adult) (pediatric): Secondary | ICD-10-CM

## 2020-09-15 DIAGNOSIS — N179 Acute kidney failure, unspecified: Secondary | ICD-10-CM

## 2020-09-15 LAB — CBC
HCT: 41.9 % (ref 39.0–52.0)
Hemoglobin: 14.9 g/dL (ref 13.0–17.0)
MCH: 32.3 pg (ref 26.0–34.0)
MCHC: 35.6 g/dL (ref 30.0–36.0)
MCV: 90.7 fL (ref 80.0–100.0)
Platelets: 156 10*3/uL (ref 150–400)
RBC: 4.62 MIL/uL (ref 4.22–5.81)
RDW: 12 % (ref 11.5–15.5)
WBC: 9.7 10*3/uL (ref 4.0–10.5)
nRBC: 0 % (ref 0.0–0.2)

## 2020-09-15 LAB — BASIC METABOLIC PANEL
Anion gap: 9 (ref 5–15)
BUN: 13 mg/dL (ref 6–20)
CO2: 28 mmol/L (ref 22–32)
Calcium: 9.1 mg/dL (ref 8.9–10.3)
Chloride: 96 mmol/L — ABNORMAL LOW (ref 98–111)
Creatinine, Ser: 1.1 mg/dL (ref 0.61–1.24)
GFR, Estimated: >60 mL/min (ref >60)
Glucose, Bld: 262 mg/dL — ABNORMAL HIGH (ref 70–99)
Potassium: 3.6 mmol/L (ref 3.5–5.1)
Sodium: 133 mmol/L — ABNORMAL LOW (ref 135–145)

## 2020-09-15 LAB — GLUCOSE, CAPILLARY
Glucose-Capillary: 137 mg/dL — ABNORMAL HIGH (ref 70–99)
Glucose-Capillary: 147 mg/dL — ABNORMAL HIGH (ref 70–99)

## 2020-09-15 MED ORDER — PREDNISONE 10 MG PO TABS: ORAL_TABLET | ORAL | 0 refills | Status: AC

## 2020-09-15 MED ORDER — ONDANSETRON HCL 4 MG PO TABS: 4.0000 mg | ORAL_TABLET | Freq: Three times a day (TID) | ORAL | 0 refills | Status: AC | PRN

## 2020-09-15 MED ORDER — TRAZODONE HCL 100 MG PO TABS: 100.0000 mg | ORAL_TABLET | Freq: Every day | ORAL | 0 refills | Status: AC

## 2020-09-15 NOTE — Progress Notes (Addendum)
Pt alert and oriented x4. Ambulatory with steady gait. Pt verbalized understanding discharge instructions. Pt verbalized no complaints. No acute distress noted. Vitals WDL. Spouse picked patient up and transported him home via POV.

## 2020-09-15 NOTE — Progress Notes (Signed)
Pt escorted to xray dept

## 2020-09-15 NOTE — Progress Notes (Signed)
[facility] Gastroenterology Progress Note  Trevor Willis 50 y.o. 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  Feeling better.  Had a bowel movement this morning.  Tolerating soft diet.  Denies nausea and vomiting.  ROS : Afebrile, negative for chest pain.   Objective: Vital signs in last 24 hours: Vitals:   09/15/20 0018 09/15/20 0446  BP: 119/70 126/71  Pulse: 75 87  Resp: 18 18  Temp: 98.2 F (36.8 C) 97.8 F (36.6 C)  SpO2: 96%     Physical Exam:  General:  Alert, cooperative, no distress, appears stated age  Head:  Normocephalic, without obvious abnormality, atraumatic  Eyes:  , EOM's intact,   Lungs:   Clear to auscultation bilaterally, respirations unlabored  Heart:  Regular rate and rhythm, S1, S2 normal  Abdomen:   Soft, non-tender, nondistended,, bowel sounds present.  Scar marks from previous surgery noted.  No peritoneal signs  Extremities: Extremities normal, atraumatic, no  edema  Pulses: 2+ and symmetric    Lab Results: Recent Labs    09/13/20 0119 09/14/20 0115 09/15/20 0752  NA 136 136 133*  K 3.3* 3.9 3.6  CL 99 100 96*  CO2 29 29 28   GLUCOSE 122* 115* 262*  BUN 7 8 13   CREATININE 1.05 0.94 1.10  CALCIUM 9.3 8.9 9.1  MG 2.0  --   --    No results for input(s): AST, ALT, ALKPHOS, BILITOT, PROT, ALBUMIN in the last 72 hours. Recent Labs    09/14/20 0115 09/15/20 0752  WBC 9.8 9.7  HGB 13.5 14.9  HCT 38.4* 41.9  MCV 90.4 90.7  PLT 147* 156   No results for input(s): LABPROT, INR in the last 72 hours.    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  Resolved. -History of small bowel obstruction requiring ileocecal resection at age 6 -Nausea with constipation.  Resolved.  Recommendation ------------------------ -He is feeling better.  Tolerating soft diet. -Okay to discharge  home from GI standpoint on tapering oral steroids.  Recommend prednisone 40 mg once a day for 1 week followed by 30 mg for another week followed by 20 mg for another week and then 10 mg once a day for another week. -Patient was advised that he will need maintenance medication for his Crohn's disease but patient is not sure about it because of possible side effects of medications. -Follow-up with Dr. 11/15/20 in 4 weeks after discharge.  GI will sign off.  Call 14 back if needed     Bosie Clos MD, FACP 09/15/2020, 9:33 AM  Contact #  [phone]

## 2020-09-16 LAB — THIOPURINE METHYLTRANSFERASE (TPMT), RBC: TPMT Activity:: 21.4 Units/mL RBC

## 2020-10-07 ENCOUNTER — Other Ambulatory Visit: Payer: Self-pay | Admitting: Student
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
## 2022-04-24 IMAGING — CR DG ABDOMEN 2V
2 series · 2 of 2 positions shown · non-contrast
Comparison: September 14, 2020

CLINICAL DATA: Abdominal pain

EXAM:
ABDOMEN - 2 VIEW

[abdomen erect]
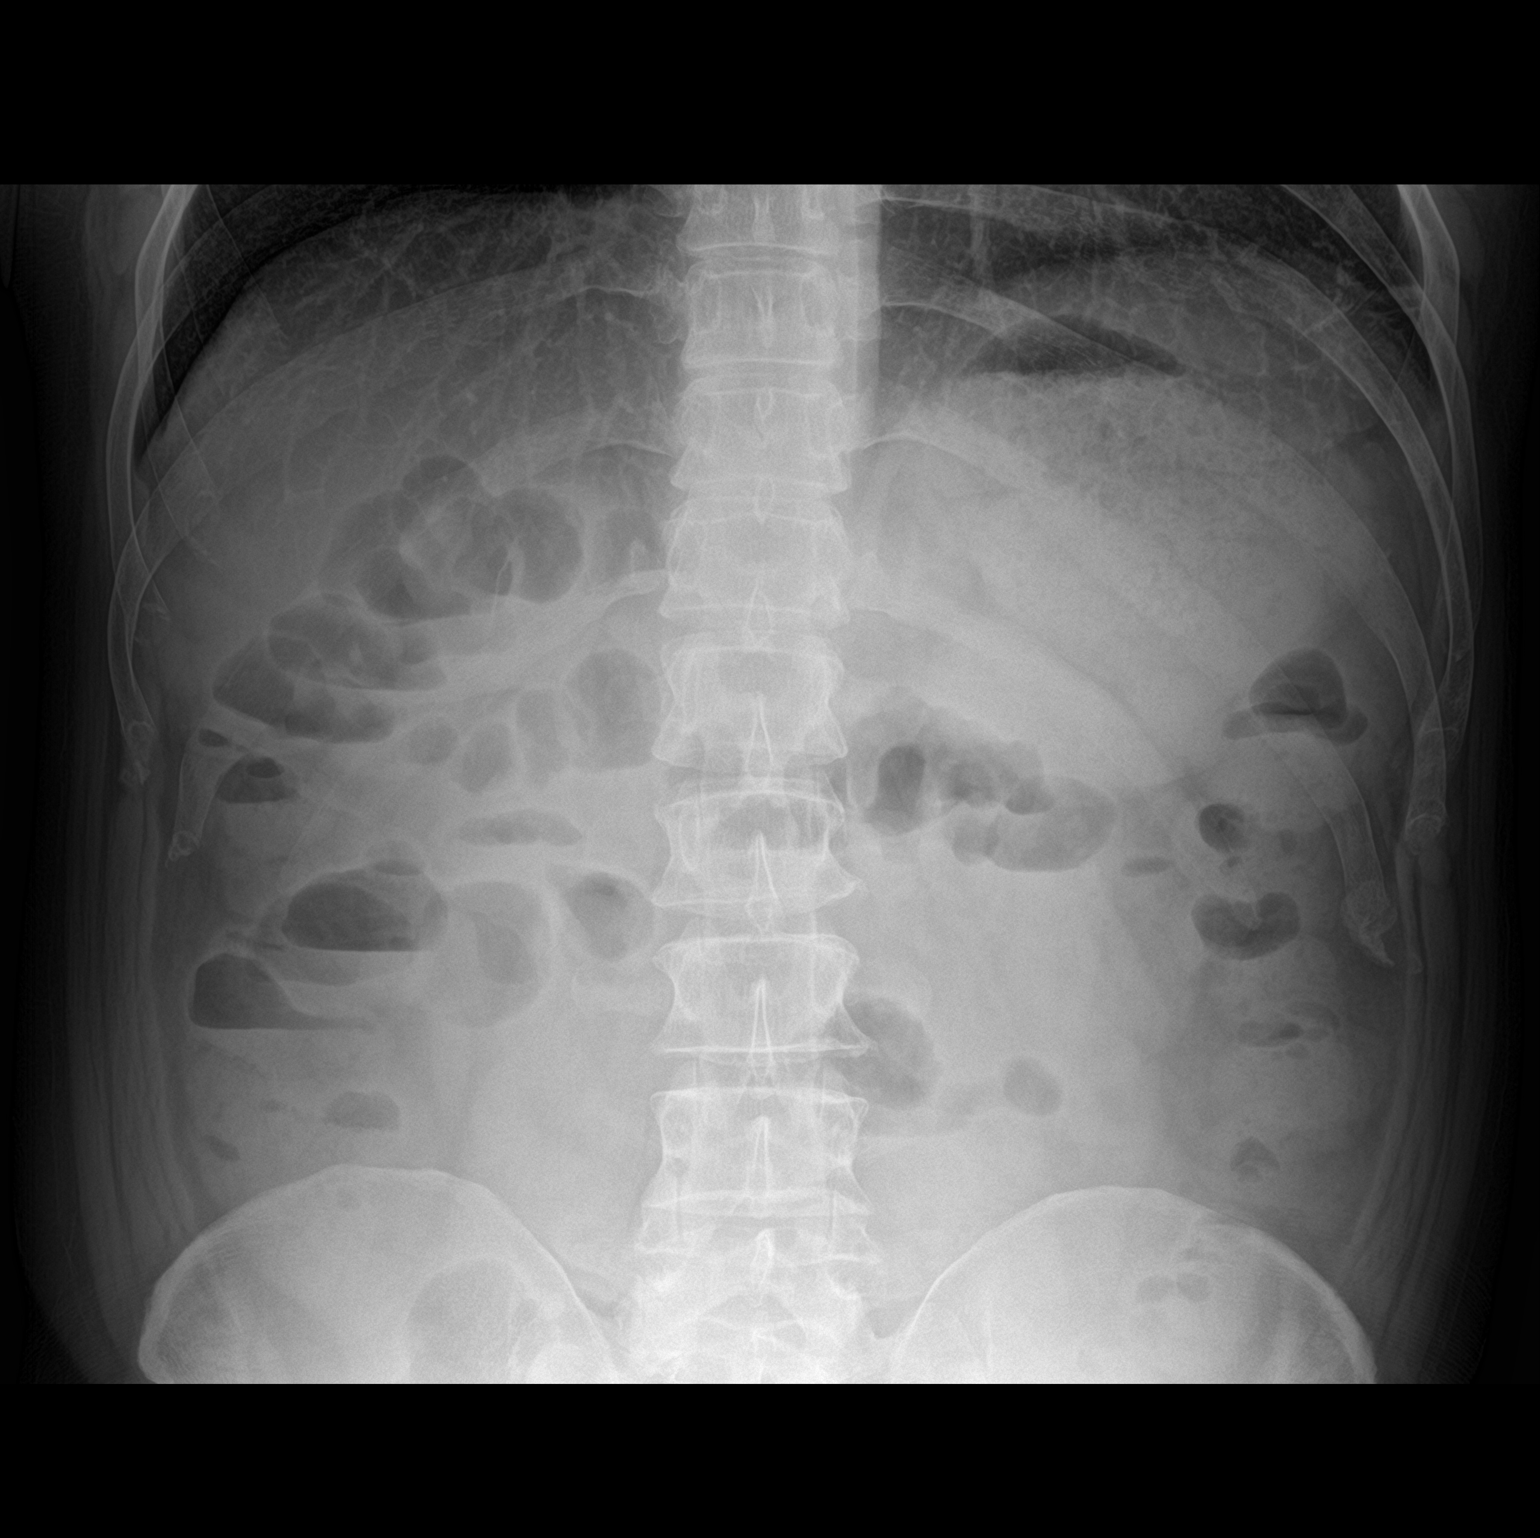

[abdomen supine]
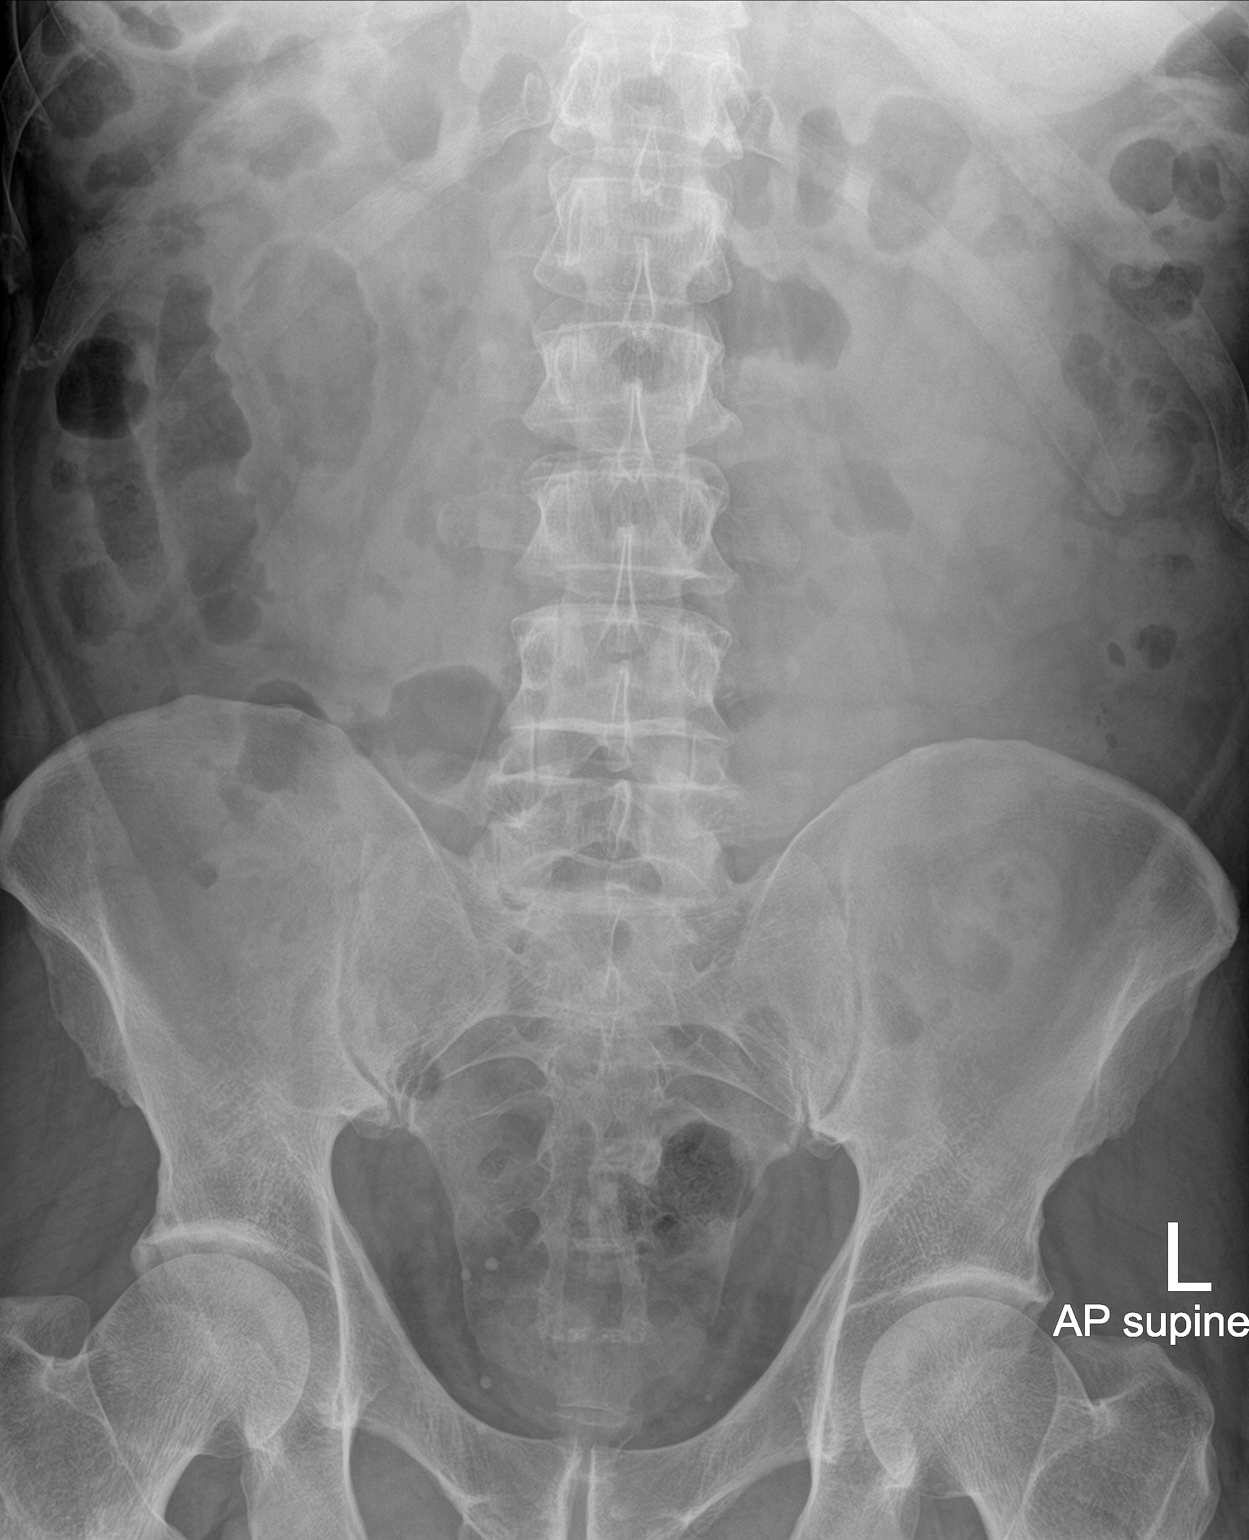

[2 of 2 positions shown; findings below may reference images not displayed]

FINDINGS: Supine and upright images obtained. Currently there is no
appreciable bowel dilatation. There are multiple air-fluid levels.
No appreciable free air. There are apparent phleboliths in the
pelvis. There is slight left base atelectasis. Lung bases otherwise
are clear.
IMPRESSION: No appreciable bowel dilatation. There remain scattered air-fluid
levels which may represent a degree of ileus or enteritis. Resolving
bowel obstruction also possible. No free air evident.
